# Patient Record
Sex: Female | Born: 1935 | Race: White | Hispanic: No | Smoking: Never smoker
Health system: Southern US, Community
[De-identification: ages and names within clinical notes are randomized; demographics above are authoritative.]

## PROBLEM LIST (undated history)

## (undated) DIAGNOSIS — R413 Other amnesia: Secondary | ICD-10-CM

## (undated) DIAGNOSIS — I10 Essential (primary) hypertension: Secondary | ICD-10-CM

## (undated) HISTORY — DX: Essential (primary) hypertension: I10

## (undated) HISTORY — PX: TONSILLECTOMY: SHX5217

## (undated) HISTORY — DX: Other amnesia: R41.3

## (undated) HISTORY — PX: CATARACT EXTRACTION, BILATERAL: SHX1313

## (undated) HISTORY — PX: THYROIDECTOMY: SHX17

---

## 2016-03-19 LAB — COMPREHENSIVE METABOLIC PANEL
Albumin: 4.1 (ref 3.5–5.0)
Calcium: 9.3 (ref 8.7–10.7)
GFR calc non Af Amer: 57
Globulin: 2

## 2016-03-19 LAB — BASIC METABOLIC PANEL
BUN: 18 (ref 4–21)
CO2: 22 (ref 13–22)
Chloride: 104 (ref 99–108)
Creatinine: 0.9 (ref 0.5–1.1)
Glucose: 112
Potassium: 4.4 (ref 3.4–5.3)
Sodium: 142 (ref 137–147)

## 2016-03-19 LAB — HEPATIC FUNCTION PANEL
ALT: 13 (ref 7–35)
AST: 18 (ref 13–35)
Alkaline Phosphatase: 56 (ref 25–125)
Bilirubin, Total: 0.6

## 2016-03-19 LAB — LIPID PANEL
Cholesterol: 203 — AB (ref 0–200)
HDL: 68 (ref 35–70)
LDL Cholesterol: 107
Triglycerides: 140 (ref 40–160)

## 2017-03-09 LAB — BASIC METABOLIC PANEL
BUN: 16 (ref 4–21)
CO2: 23 — AB (ref 13–22)
Chloride: 105 (ref 99–108)
Creatinine: 0.8 (ref 0.5–1.1)
Glucose: 99
Potassium: 4.9 (ref 3.4–5.3)
Sodium: 139 (ref 137–147)

## 2017-03-09 LAB — COMPREHENSIVE METABOLIC PANEL
Albumin: 4.3 (ref 3.5–5.0)
Calcium: 9.8 (ref 8.7–10.7)
GFR calc non Af Amer: 65
Globulin: 2.2

## 2017-03-09 LAB — TSH: TSH: 0.77 (ref <=5.90)

## 2017-03-09 LAB — VITAMIN D 25 HYDROXY (VIT D DEFICIENCY, FRACTURES): Vit D, 25-Hydroxy: 28.6

## 2019-01-03 LAB — CBC AND DIFFERENTIAL
HCT: 36 (ref 36–46)
Hemoglobin: 12.3 (ref 12.0–16.0)
Platelets: 199 (ref 150–399)
WBC: 7.6

## 2019-01-03 LAB — HEPATIC FUNCTION PANEL
ALT: 16 (ref 7–35)
AST: 30 (ref 13–35)
Alkaline Phosphatase: 56 (ref 25–125)
Bilirubin, Total: 1

## 2019-01-03 LAB — BASIC METABOLIC PANEL
BUN: 15 (ref 4–21)
CO2: 21 (ref 13–22)
Chloride: 108 (ref 99–108)
Creatinine: 0.8 (ref 0.5–1.1)
Glucose: 103
Potassium: 4 (ref 3.4–5.3)
Sodium: 142 (ref 137–147)

## 2019-01-03 LAB — COMPREHENSIVE METABOLIC PANEL
Albumin: 4.2 (ref 3.5–5.0)
Calcium: 9.3 (ref 8.7–10.7)
GFR calc non Af Amer: 61
Globulin: 2.1

## 2019-01-03 LAB — TSH: TSH: 0.67 (ref 0.41–5.90)

## 2019-01-03 LAB — LIPID PANEL
Cholesterol: 179 (ref 0–200)
HDL: 65 (ref 35–70)
LDL Cholesterol: 97
Triglycerides: 97 (ref 40–160)

## 2019-01-03 LAB — CBC: RBC: 3.92 (ref 3.87–5.11)

## 2019-02-04 LAB — ANA COMPREHENSIVE PANEL
ANA Ab, IFA: POSITIVE — AB
Anti JO-1: NEGATIVE
CCP IgG Antibodies: NEGATIVE
CENTROMERE AB: NEGATIVE
Rheumatoid Factor (IgA): NEGATIVE
Ribosomal P Ab: NEGATIVE
Scleroderma (Scl-70) (ENA) Antibody, IgG: NEGATIVE
Sjogren's Anti-SS-A: NEGATIVE
dsDNA Ab: NEGATIVE

## 2019-02-04 LAB — HEMOGLOBIN A1C: Hemoglobin A1C: 5.5

## 2019-02-04 LAB — VITAMIN D 25 HYDROXY (VIT D DEFICIENCY, FRACTURES): Vit D, 25-Hydroxy: 24

## 2019-02-04 LAB — VITAMIN B12: Vitamin B-12: 972

## 2019-02-04 LAB — TSH: TSH: 0.19 — AB (ref 0.41–5.90)

## 2019-04-18 LAB — TSH: TSH: 16.8 — AB (ref <=5.90)

## 2019-04-18 LAB — T4, FREE: Free T4: 1.06

## 2019-06-22 ENCOUNTER — Other Ambulatory Visit: Payer: Self-pay

## 2019-06-22 ENCOUNTER — Ambulatory Visit (INDEPENDENT_AMBULATORY_CARE_PROVIDER_SITE_OTHER): Payer: Medicare HMO | Admitting: Family Medicine

## 2019-06-22 ENCOUNTER — Encounter: Payer: Self-pay | Admitting: Family Medicine

## 2019-06-22 VITALS — BP 144/55 | HR 64 | Temp 97.3°F | Resp 16 | Ht 64.0 in | Wt 103.6 lb

## 2019-06-22 DIAGNOSIS — F0390 Unspecified dementia without behavioral disturbance: Secondary | ICD-10-CM | POA: Diagnosis not present

## 2019-06-22 DIAGNOSIS — I10 Essential (primary) hypertension: Secondary | ICD-10-CM | POA: Insufficient documentation

## 2019-06-22 DIAGNOSIS — E039 Hypothyroidism, unspecified: Secondary | ICD-10-CM | POA: Diagnosis not present

## 2019-06-22 DIAGNOSIS — F03A0 Unspecified dementia, mild, without behavioral disturbance, psychotic disturbance, mood disturbance, and anxiety: Secondary | ICD-10-CM

## 2019-06-22 DIAGNOSIS — F01518 Vascular dementia, unspecified severity, with other behavioral disturbance: Secondary | ICD-10-CM | POA: Insufficient documentation

## 2019-06-22 DIAGNOSIS — Z7689 Persons encountering health services in other specified circumstances: Secondary | ICD-10-CM | POA: Diagnosis not present

## 2019-06-22 DIAGNOSIS — F0151 Vascular dementia with behavioral disturbance: Secondary | ICD-10-CM | POA: Insufficient documentation

## 2019-06-22 DIAGNOSIS — E538 Deficiency of other specified B group vitamins: Secondary | ICD-10-CM | POA: Diagnosis not present

## 2019-06-22 NOTE — Patient Instructions (Addendum)
Thank you for coming to the office today.  Labs today, will re order medicines and thyroid once I review test results. You will get a phone call with results.  Requested records first for review. Can discuss future treatment plans. Consider medications for memory loss and other options.  Likely will work on referral to Chronic Care Management - phone call from Nurse Case Manager / Social Work to determine if any needs are in community.  Please schedule a Follow-up Appointment to: Return in about 6 weeks (around 08/03/2019) for 6 week Follow-up Memory Loss/Dementia - needs MMSE.  If you have any other questions or concerns, please feel free to call the office or send a message through MyChart. You may also schedule an earlier appointment if necessary.  Additionally, you may be receiving a survey about your experience at our office within a few days to 1 week by e-mail or mail. We value your feedback.  Saralyn Pilar, DO [facility], [place]

## 2019-06-22 NOTE — Progress Notes (Signed)
Subjective:    Patient ID: Kathy Mata, female    DOB: 1925-01-19, 86 y.o.   MRN: [id number]  Kathy Mata is a 86 y.o. female presenting on 06/22/2019 for Establish Care (memory issue)  Daughter in law - Blanca Friend here today with patient to provide history, patient has memory loss.  Patient was recommended to our office by friends of theirs who are my patient.  She has recently moved to this area, from [place] now to re-establish care.   HPI   Hypothyroidism Chronic problem. In past has been on other doses. Now currently has 1-2 weeks left on med for Levothyroxine daily. Was on 27 in past then they adjusted it to 50, then back up to 75, last lab was in 03/2019 entered into chart  Cognitive Impairment / Memory Loss vs Dementia Reports problem now for while increasing issue with memory loss and dementia type symptoms. She is trying to stay independent. Now moved to relocate with near friends/family. She lives alone has daughter in law who lives 3 min away helps her daily. She is not always keeping up with appetite and eating but not having other symptoms that limit her from eating. She has some mild weight loss. Still adjusting to this area and her new move - She enjoys line dancing - There were no reports of safety concerns related to her memory. No wandering or behavioral abnormality. - She is never on memory medication - She saw a Neurologist in consultation for this problem in [place] through [facility] and we are requesting records. They did MRI - ultimately report is that there was no significant problem for her.  Hypertension No prior problem but has had occasional elevated readings, was put on low dose med Metoprolol XL 25mg  daily, taking this now without issue. May have some anxiety with new doctors office.  Depression screen PHQ 2/9 06/22/2019  Decreased Interest 0  Down, Depressed, Hopeless 0  PHQ - 2 Score 0    Past Medical History:  Diagnosis Date  . Hypertension    . Memory deficit    Past Surgical History:  Procedure Laterality Date  . THYROIDECTOMY    . TONSILECTOMY, ADENOIDECTOMY, BILATERAL MYRINGOTOMY AND TUBES     Social History   Socioeconomic History  . Marital status: Not on file    Spouse name: Not on file  . Number of children: Not on file  . Years of education: Not on file  . Highest education level: Not on file  Occupational History  . Not on file  Tobacco Use  . Smoking status: Never Smoker  . Smokeless tobacco: Never Used  Substance and Sexual Activity  . Alcohol use: Never  . Drug use: Never  . Sexual activity: Not on file  Other Topics Concern  . Not on file  Social History Narrative  . Not on file   Social Determinants of Health   Financial Resource Strain:   . Difficulty of Paying Living Expenses:   Food Insecurity:   . Worried About 08/22/2019 in the Last Year:   . Programme researcher, broadcasting/film/video in the Last Year:   Transportation Needs:   . Barista (Medical):   Freight forwarder Lack of Transportation (Non-Medical):   Physical Activity:   . Days of Exercise per Week:   . Minutes of Exercise per Session:   Stress:   . Feeling of Stress :   Social Connections:   . Frequency of Communication with Friends and  Family:   . Frequency of Social Gatherings with Friends and Family:   . Attends Religious Services:   . Active Member of Clubs or Organizations:   . Attends Archivist Meetings:   Marland Kitchen Marital Status:   Intimate Partner Violence:   . Fear of Current or Ex-Partner:   . Emotionally Abused:   Marland Kitchen Physically Abused:   . Sexually Abused:    History reviewed. No pertinent family history. Current Outpatient Medications on File Prior to Visit  Medication Sig  . levothyroxine (SYNTHROID) 75 MCG tablet   . metoprolol succinate (TOPROL-XL) 25 MG 24 hr tablet    No current facility-administered medications on file prior to visit.    Review of Systems Per HPI unless specifically indicated above        Objective:    BP (!) 144/55   Pulse 64   Temp (!) 97.3 F (36.3 C) (Temporal)   Resp 16   Ht 5\' 4"  (1.626 m)   Wt 103 lb 9.6 oz (47 kg)   SpO2 98%   BMI 17.78 kg/m   Wt Readings from Last 3 Encounters:  06/22/19 103 lb 9.6 oz (47 kg)    Physical Exam Vitals and nursing note reviewed.  Constitutional:      General: She is not in acute distress.    Appearance: She is well-developed. She is not diaphoretic.     Comments: Currently well appearing 86 year old elderly female, comfortable, cooperative  HENT:     Head: Normocephalic and atraumatic.  Eyes:     General:        Right eye: No discharge.        Left eye: No discharge.     Conjunctiva/sclera: Conjunctivae normal.  Neck:     Thyroid: No thyromegaly.  Cardiovascular:     Rate and Rhythm: Normal rate and regular rhythm.     Heart sounds: Normal heart sounds. No murmur.  Pulmonary:     Effort: Pulmonary effort is normal. No respiratory distress.     Breath sounds: Normal breath sounds. No wheezing or rales.  Musculoskeletal:        General: Normal range of motion.     Cervical back: Normal range of motion and neck supple.  Lymphadenopathy:     Cervical: No cervical adenopathy.  Skin:    General: Skin is warm and dry.     Findings: No erythema or rash.  Neurological:     Mental Status: She is alert. Mental status is at baseline.     Sensory: No sensory deficit.     Motor: No weakness.     Comments: Oriented to person only  Psychiatric:        Behavior: Behavior normal.     Comments: Well groomed, good eye contact, normal speech and thoughts but difficulty participating in conversation due to poor hearing and cognition. She is able to respond appropriately when asked questions and follows most commands, she has difficulty answering more complex questions due to cognitive processing and exhibits a lot of word finding or misunderstanding and incomplete answers. Frustrated at times will give up on an answer.       6CIT Screen 06/22/2019  What Year? 4 points  What month? 3 points  What time? 0 points  Count back from 20 0 points  Months in reverse 4 points  Repeat phrase 10 points  Total Score 21     Results for orders placed or performed in visit on 06/22/19  TSH  Result Value Ref Range   TSH 16.80 (A) 0.41 - 5.90  T4, free  Result Value Ref Range   Free T4 1.06       Assessment & Plan:   Problem List Items Addressed This Visit    Mild dementia (HCC)   Relevant Orders   TSH   T4, free   COMPLETE METABOLIC PANEL WITH GFR   CBC with Differential/Platelet   Vitamin B12   Ambulatory referral to Chronic Care Management Services   Essential hypertension   Relevant Medications   metoprolol succinate (TOPROL-XL) 25 MG 24 hr tablet   Other Relevant Orders   COMPLETE METABOLIC PANEL WITH GFR   CBC with Differential/Platelet   Ambulatory referral to Chronic Care Management Services   Acquired hypothyroidism - Primary   Relevant Medications   levothyroxine (SYNTHROID) 75 MCG tablet   metoprolol succinate (TOPROL-XL) 25 MG 24 hr tablet   Other Relevant Orders   TSH   T4, free   Ambulatory referral to Chronic Care Management Services    Other Visit Diagnoses    Encounter to establish care with new doctor       Vitamin B12 deficiency       Relevant Orders   Vitamin B12      #Dementia Currently stable, with history of progressive cognitive decline memory loss vs new concern for dementia, clinically FAST Stage 4 approximately (needs help with ADLs), no history behavior or safety concerns. - Some increased caregiver needs but tries to stay independent - never on meds Had neuro eval [place]/[facility] requesting record today including MRI  Plan: 1. Reviewed Dementia prognosis and management, explained that may need additional support in future. - referral today to CCM nurse case management and social work for further evaluation of community and care needs, with goal to keep her  independent and supported. May need inc lvl of care in future.   Requested outside records from prior PCP Dr  Mare Ferrari [facility]) also requested Dr Burnett Corrente [facility] out of [place].  #Hypothyroidism Chronic problem Stable, on medication daily levothyroxine Last lab reviewed 03/2019 elevated TSH >16 Will re-check labs today  Also check chemistry vitamin testing in setting of dementia  #HTN keep on current medication   Orders Placed This Encounter  Procedures  . TSH  . T4, free  . COMPLETE METABOLIC PANEL WITH GFR  . CBC with Differential/Platelet  . Vitamin B12  . TSH    This external order was created through the Results Console.  . T4, free    This order was created through External Result Entry  . Ambulatory referral to Chronic Care Management Services    Referral Priority:   Routine    Referral Type:   Consultation    Referral Reason:   Care Coordination    Number of Visits Requested:   1     No orders of the defined types were placed in this encounter.   Follow up plan: Return in about 6 weeks (around 08/03/2019) for 6 week Follow-up Memory Loss/Dementia - needs MMSE.  Saralyn Pilar, DO [facility] 06/22/2019, 10:38 AM

## 2019-06-23 ENCOUNTER — Telehealth: Payer: Self-pay | Admitting: Family Medicine

## 2019-06-23 LAB — TSH: TSH: 1.5 mIU/L (ref 0.40–4.50)

## 2019-06-23 LAB — VITAMIN B12: Vitamin B-12: 322 pg/mL (ref 200–1100)

## 2019-06-23 LAB — COMPLETE METABOLIC PANEL WITH GFR
AG Ratio: 1.7 (calc) (ref 1.0–2.5)
ALT: 34 U/L — ABNORMAL HIGH (ref 6–29)
AST: 27 U/L (ref 10–35)
Albumin: 4.7 g/dL (ref 3.6–5.1)
Alkaline phosphatase (APISO): 55 U/L (ref 37–153)
BUN: 14 mg/dL (ref 7–25)
CO2: 23 mmol/L (ref 20–32)
Calcium: 9.8 mg/dL (ref 8.6–10.4)
Chloride: 106 mmol/L (ref 98–110)
Creat: 0.82 mg/dL (ref 0.60–0.88)
GFR, Est African American: 71 mL/min/{1.73_m2} (ref >=60)
GFR, Est Non African American: 62 mL/min/{1.73_m2} (ref >=60)
Globulin: 2.8 g/dL (calc) (ref 1.9–3.7)
Glucose, Bld: 80 mg/dL (ref 65–99)
Potassium: 4.4 mmol/L (ref 3.5–5.3)
Sodium: 140 mmol/L (ref 135–146)
Total Bilirubin: 0.3 mg/dL (ref 0.2–1.2)
Total Protein: 7.5 g/dL (ref 6.1–8.1)

## 2019-06-23 LAB — CBC WITH DIFFERENTIAL/PLATELET
Absolute Monocytes: 504 cells/uL (ref 200–950)
Basophils Absolute: 34 cells/uL (ref 0–200)
Basophils Relative: 0.4 %
Eosinophils Absolute: 67 cells/uL (ref 15–500)
Eosinophils Relative: 0.8 %
HCT: 41.3 % (ref 35.0–45.0)
Hemoglobin: 13.4 g/dL (ref 11.7–15.5)
Lymphs Abs: 2915 cells/uL (ref 850–3900)
MCH: 28.6 pg (ref 27.0–33.0)
MCHC: 32.4 g/dL (ref 32.0–36.0)
MCV: 88.2 fL (ref 80.0–100.0)
MPV: 9.5 fL (ref 7.5–12.5)
Monocytes Relative: 6 %
Neutro Abs: 4880 cells/uL (ref 1500–7800)
Neutrophils Relative %: 58.1 %
Platelets: 275 10*3/uL (ref 140–400)
RBC: 4.68 10*6/uL (ref 3.80–5.10)
RDW: 13.3 % (ref 11.0–15.0)
Total Lymphocyte: 34.7 %
WBC: 8.4 10*3/uL (ref 3.8–10.8)

## 2019-06-23 LAB — T4, FREE: Free T4: 1.1 ng/dL (ref 0.8–1.8)

## 2019-06-23 NOTE — Chronic Care Management (AMB) (Signed)
  Care Management   Outreach Note  06/23/2019 Name: Kathy Mata MRN: [id number] DOB: 01/25/1924  Referred by: [name], DO Reason for referral :  Care Management (CM Initial outreach unsuccessful)   An unsuccessful telephone outreach was attempted today. The patient was referred to the case management team for assistance with care management and care coordination.   Follow Up Plan: A HIPPA compliant phone message was left for the patient providing contact information and requesting a return call.  The care management team will reach out to the patient again over the next 7 days.  If patient returns call to provider office, please advise to call Embedded Care Management Care Guide Elisha Ponder LPN at [phone]  Elisha Ponder, LPN Health Advisor, Embedded Care Coordination [facility] Health Care Management ??[email or web address] ??[phone]

## 2019-06-28 ENCOUNTER — Other Ambulatory Visit: Payer: Self-pay

## 2019-06-28 DIAGNOSIS — I10 Essential (primary) hypertension: Secondary | ICD-10-CM

## 2019-06-28 DIAGNOSIS — E039 Hypothyroidism, unspecified: Secondary | ICD-10-CM

## 2019-06-28 MED ORDER — METOPROLOL SUCCINATE ER 25 MG PO TB24: 25.0000 mg | ORAL_TABLET | Freq: Every day | ORAL | 1 refills | Status: AC

## 2019-06-28 MED ORDER — LEVOTHYROXINE SODIUM 75 MCG PO TABS: 75.0000 ug | ORAL_TABLET | Freq: Every day | ORAL | 1 refills | Status: AC

## 2019-06-30 NOTE — Chronic Care Management (AMB) (Signed)
  Care Management   Note  06/30/2019 Name: Kathy Mata MRN: [id number] DOB: [birth year]  Kathy Mata is a 86 y.o. year old female who is a primary care patient of [name], DO. I reached out to Elvin So by phone today in response to a referral sent by [name] health plan.    [name] was given information about care management services today including:  1. Care management services include personalized support from designated clinical staff supervised by her physician, including individualized plan of care and coordination with other care providers 2. 24/7 contact phone numbers for assistance for urgent and routine care needs. 3. The patient may stop care management services at any time by phone call to the office staff.  Patient wishes to consider information provided and/or speak with a member of the care team before deciding about enrollment in care management services.   Follow up plan: Outreach by embedded care coordination clinical team member scheduled. Clinician will address patient questions and provide further information about services. Clinician will verify enrollment decision at the time of outreach.   Elisha Ponder, LPN Health Advisor, Embedded Care Coordination [place] Care Management ??[email or web address] ??[phone]

## 2019-07-03 ENCOUNTER — Ambulatory Visit: Payer: Self-pay | Admitting: General Practice

## 2019-07-03 ENCOUNTER — Telehealth: Payer: Self-pay

## 2019-07-03 NOTE — Chronic Care Management (AMB) (Signed)
  Care Management   Note  07/03/2019 Name: Kathy Mata MRN: [id number] DOB: 03/13/1924  Initial outreach call made to the patients DIL, Blanca Friend today. The DIL takes care of the patients needs and had ask the enrollment team to have the [facility] call and explain services for the CCM program. Program details explained to the DIL.  The DIL states that the patient is getting settled in and she knows there will be a time when she will need more assistance but she wants to give it time to see how the patient will do in her new environment.  Extensive information provided to the DIL of how the pharmacist, LCSW, and RNCM work together with the pcp to help the patient and the caregivers get the things needed for the patient to maintain her health and well being.   The DIL states the only thing needed right now is for her to have a better understanding of why the patients memory is declining and what is the best way to handle the situation when she tells her DIL "this is not mine, this is yours".  Recommendation given to the DIL on the book: "The 36-hour day".  The DIL still declined services at this time but will reconsider if needed later. The DIL did ask if the RNCM could have the pcp office call her because she received a bill for services for the MD visit.  The DIL expressed she normally does not have any copays for MD visits. Will send an in-basket message to the office staff to have someone reach out to the DIL for help with this matter.   The care management team is available to follow up with the patient after provider conversation with the patient regarding recommendation for care management engagement and subsequent re-referral to the care management team.   Alto Denver RN, MSN, CCM Community Care Coordinator [facility]  [facility] [place] Mobile: [phone]

## 2019-07-06 ENCOUNTER — Other Ambulatory Visit: Payer: Self-pay | Admitting: Family Medicine

## 2019-07-06 ENCOUNTER — Telehealth: Payer: Self-pay

## 2019-07-06 ENCOUNTER — Encounter: Payer: Self-pay | Admitting: Family Medicine

## 2019-07-06 DIAGNOSIS — I6521 Occlusion and stenosis of right carotid artery: Secondary | ICD-10-CM

## 2019-07-06 DIAGNOSIS — I6523 Occlusion and stenosis of bilateral carotid arteries: Secondary | ICD-10-CM | POA: Insufficient documentation

## 2019-07-06 DIAGNOSIS — I739 Peripheral vascular disease, unspecified: Secondary | ICD-10-CM | POA: Insufficient documentation

## 2019-07-06 NOTE — Telephone Encounter (Signed)
Copied from CRM [id number]. Topic: General - Other >> Jul 06, 2019  9:49 AM Herby Abraham C wrote: Reason for CRM: pt's daughter called in to request a letter from provider stating that pt is no longer able to handle her financial affairs. Daughter would like to pick up whenever ready.    Anne: [phone]

## 2019-07-06 NOTE — Telephone Encounter (Signed)
Called [employer] HCPOA. Reviewed outside records [facility], scanned to chart. Updated lab results abstracted.  See letter written today. Regarding her dementia memory loss and can no longer manage her finances.  Letter given to Thurston Hole already today at office.  Saralyn Pilar, DO [facility] 07/06/2019, 12:16 PM

## 2019-07-16 ENCOUNTER — Encounter: Payer: Self-pay | Admitting: Family Medicine

## 2019-07-16 DIAGNOSIS — I6523 Occlusion and stenosis of bilateral carotid arteries: Secondary | ICD-10-CM

## 2019-07-16 DIAGNOSIS — I700 Atherosclerosis of aorta: Secondary | ICD-10-CM | POA: Insufficient documentation

## 2019-07-18 ENCOUNTER — Ambulatory Visit: Payer: Self-pay | Admitting: Family Medicine

## 2019-07-20 ENCOUNTER — Telehealth: Payer: Self-pay

## 2019-07-31 DIAGNOSIS — F418 Other specified anxiety disorders: Secondary | ICD-10-CM

## 2019-07-31 DIAGNOSIS — F0390 Unspecified dementia without behavioral disturbance: Secondary | ICD-10-CM

## 2019-07-31 DIAGNOSIS — F03A0 Unspecified dementia, mild, without behavioral disturbance, psychotic disturbance, mood disturbance, and anxiety: Secondary | ICD-10-CM

## 2019-07-31 MED ORDER — ESCITALOPRAM OXALATE 5 MG PO TABS: 5.0000 mg | ORAL_TABLET | Freq: Every day | ORAL | 0 refills | Status: DC

## 2019-08-03 ENCOUNTER — Ambulatory Visit (INDEPENDENT_AMBULATORY_CARE_PROVIDER_SITE_OTHER): Payer: Medicare HMO | Admitting: Family Medicine

## 2019-08-03 ENCOUNTER — Ambulatory Visit: Payer: Self-pay | Admitting: Family Medicine

## 2019-08-03 ENCOUNTER — Encounter: Payer: Self-pay | Admitting: Family Medicine

## 2019-08-03 ENCOUNTER — Other Ambulatory Visit: Payer: Self-pay

## 2019-08-03 VITALS — BP 118/53 | HR 80 | Temp 96.9°F | Resp 16 | Ht 64.0 in | Wt 100.0 lb

## 2019-08-03 DIAGNOSIS — F0390 Unspecified dementia without behavioral disturbance: Secondary | ICD-10-CM | POA: Diagnosis not present

## 2019-08-03 DIAGNOSIS — E039 Hypothyroidism, unspecified: Secondary | ICD-10-CM

## 2019-08-03 DIAGNOSIS — F03A0 Unspecified dementia, mild, without behavioral disturbance, psychotic disturbance, mood disturbance, and anxiety: Secondary | ICD-10-CM

## 2019-08-03 DIAGNOSIS — I700 Atherosclerosis of aorta: Secondary | ICD-10-CM

## 2019-08-03 NOTE — Assessment & Plan Note (Signed)
Identified on image Not on statin therapy. Declined She is on ASA 325 per neurology Patient is on BB for HTN.

## 2019-08-03 NOTE — Patient Instructions (Addendum)
Thank you for coming to the office today.  Start Escitalopram 5mg  daily Keep on current dose Levothyroxine daily  Let know if you need any extra help.   Please schedule a Follow-up Appointment to: Return in about 6 months (around 02/03/2020) for 6 month follow-up Thyroid, Dementia, Mood, Lab draw after visit.  If you have any other questions or concerns, please feel free to call the office or send a message through MyChart. You may also schedule an earlier appointment if necessary.  Additionally, you may be receiving a survey about your experience at our office within a few days to 1 week by e-mail or mail. We value your feedback.  02/05/2020, DO [facility], [facility]

## 2019-08-03 NOTE — Progress Notes (Signed)
Subjective:    Patient ID: Kathy Mata, female    DOB: 20-Apr-1924, 86 y.o.   MRN: [id number]  Kathy Mata is a 86 y.o. female presenting on 08/03/2019 for Dementia  Accompanied by daughter in law, Blanca Friend [facility]. Patient is unable to provide history due to dementia.  HPI    Hypothyroidism Chronic problem. In past has been on other dose from 50 to 88. Last visit had labs 06/2019, has been stable on Levothyroxine daily now. With normal TSH and Free T4.  Dementia Chronic progressive problem. See initial intake history 06/22/19 Interval update, reviewed records from [facility]. To be scanned to chart. Reviewed prior imaging MRI Brain.  Reports problem now for while increasing issue with memory loss and dementia type symptoms. She is trying to stay independent. Now moved to relocate with near friends/family. She lives alone has daughter in law who lives 3 min away helps her daily - No wandering or behavioral abnormality. - She is never on memory medication  Patient/HCPOA was contacted by our Chronic Care Management Team in interval to connect to community resources and they declined to continue these services, stating it is not needed at this time.  Labs from last visit reviewed.  Today report is no new change. HCPOA did contact us via MyChart earlier this week to request for Lexapro 5mg  due to patient's agitation, seems to report labile agitation and mood at times.   Aortic Atherosclerosis Identified on imaging. Outside Abdominal Aortic 2018 - confirms but no evidence AAA   Depression screen [facility] 2/9 08/03/2019 06/22/2019  Decreased Interest 0 0  Down, Depressed, Hopeless 0 0  PHQ - 2 Score 0 0   No flowsheet data found.    Social History   Tobacco Use  . Smoking status: Never Smoker  . Smokeless tobacco: Never Used  Substance Use Topics  . Alcohol use: Never  . Drug use: Never    Review of Systems Per HPI unless specifically indicated above       Objective:    BP (!) 118/53   Pulse 80   Temp (!) 96.9 F (36.1 C) (Temporal)   Resp 16   Ht 5\' 4"  (1.626 m)   Wt 100 lb (45.4 kg)   SpO2 99%   BMI 17.16 kg/m   Wt Readings from Last 3 Encounters:  08/03/19 100 lb (45.4 kg)  06/22/19 103 lb 9.6 oz (47 kg)    Physical Exam Vitals and nursing note reviewed.  Constitutional:      General: She is not in acute distress.    Appearance: She is well-developed. She is not diaphoretic.     Comments: Currently well appearing 86 year old elderly female, comfortable, cooperative  HENT:     Head: Normocephalic and atraumatic.  Eyes:     General:        Right eye: No discharge.        Left eye: No discharge.     Conjunctiva/sclera: Conjunctivae normal.  Neck:     Thyroid: No thyromegaly.  Cardiovascular:     Rate and Rhythm: Normal rate and regular rhythm.     Heart sounds: Normal heart sounds. No murmur heard.   Pulmonary:     Effort: Pulmonary effort is normal. No respiratory distress.     Breath sounds: Normal breath sounds. No wheezing or rales.  Musculoskeletal:        General: Normal range of motion.     Cervical back: Normal range of motion  and neck supple.  Lymphadenopathy:     Cervical: No cervical adenopathy.  Skin:    General: Skin is warm and dry.     Findings: No erythema or rash.  Neurological:     Mental Status: She is alert. Mental status is at baseline.     Sensory: No sensory deficit.     Motor: No weakness.     Comments: Oriented to person only  Psychiatric:        Behavior: Behavior normal.     Comments: Well groomed, good eye contact, normal speech and thoughts but difficulty participating in conversation due to poor hearing and cognition. She is able to respond appropriately when asked questions and follows most commands, she has difficulty answering more complex questions due to cognitive processing and exhibits a lot of word finding or misunderstanding and incomplete answers. She is frequently repeating  phrases and responses. Very poor insight.      MMSE - Mini Mental State Exam 08/03/2019  Orientation to time 1  Orientation to Place 1  Registration 3  Attention/ Calculation 5  Recall 2  Language- name 2 objects 2  Language- repeat 0  Language- follow 3 step command 3  Language- read & follow direction 0  Write a sentence 1  Copy design 1  Total score 19    6CIT Screen 06/22/2019  What Year? 4 points  What month? 3 points  What time? 0 points  Count back from 20 0 points  Months in reverse 4 points  Repeat phrase 10 points  Total Score 21      Results for orders placed or performed in visit on 07/16/19  Basic metabolic panel  Result Value Ref Range   Glucose 112    BUN 18 4 - 21   CO2 22 13 - 22   Creatinine 0.9 0.5 - 1.1   Potassium 4.4 3.4 - 5.3   Sodium 142 137 - 147   Chloride 104 99 - 108  Comprehensive metabolic panel  Result Value Ref Range   Globulin 2    GFR calc non Af Amer 57    Calcium 9.3 8.7 - 10.7   Albumin 4.1 3.5 - 5.0  Hepatic function panel  Result Value Ref Range   Alkaline Phosphatase 56 25 - 125   ALT 13 7 - 35   AST 18 13 - 35   Bilirubin, Total 0.6   Lipid panel  Result Value Ref Range   Triglycerides 140 40 - 160   Cholesterol 203 (A) 0 - 200   HDL 68 35 - 70   LDL Cholesterol 107   VITAMIN D 25 Hydroxy (Vit-D Deficiency, Fractures)  Result Value Ref Range   Vit D, 25-Hydroxy 28.6   Basic metabolic panel  Result Value Ref Range   Glucose 99    BUN 16 4 - 21   CO2 23 (A) 13 - 22   Creatinine 0.8 0.5 - 1.1   Potassium 4.9 3.4 - 5.3   Sodium 139 137 - 147   Chloride 105 99 - 108  Comprehensive metabolic panel  Result Value Ref Range   Globulin 2.2    GFR calc non Af Amer 65    Calcium 9.8 8.7 - 10.7   Albumin 4.3 3.5 - 5.0  TSH  Result Value Ref Range   TSH 0.77 0.41 - 5.90      Assessment & Plan:   Problem List Items Addressed This Visit    Mild dementia (HCC) -  Primary   Aortic atherosclerosis (HCC)     Identified on image Not on statin therapy. Declined She is on ASA 325 per neurology Patient is on BB for HTN.      Acquired hypothyroidism      #Dementia Currently stable, with history of progressive cognitive decline memory loss vs new concern for dementia, clinically FAST Stage 4 approximately (needs help with ADLs), no history behavior or safety concerns. - Some increased caregiver needs but tries to stay independent - never on meds Reviewed prior Neurology evaluation including MRI.  Plan: 1. Reviewed Dementia prognosis and management, explained that may need additional support in future. She has already been referred to our CCM team and these services were declined by [facility] at this time. I advised them that we are happy to connect them with any community resources or other assistance in future as needed, just contact us back. - Defer future neurology referral - Defer dementia medications at this time, discussed benefit/risk side effect, can reconsider - Will add Escitalopram 5mg  daily for mood/agitation only  #Hypothyroidism Chronic problem Stable, on medication daily levothyroxine Last lab reviewed 06/2019 normal Repeat in 6 months approx.   No orders of the defined types were placed in this encounter.    Follow up plan: Return in about 6 months (around 02/03/2020) for 6 month follow-up Thyroid, Dementia, Mood, Lab draw after visit.  02/05/2020, DO [facility] 08/03/2019, 12:10 PM

## 2019-08-15 DIAGNOSIS — F03A0 Unspecified dementia, mild, without behavioral disturbance, psychotic disturbance, mood disturbance, and anxiety: Secondary | ICD-10-CM

## 2019-08-15 DIAGNOSIS — F0390 Unspecified dementia without behavioral disturbance: Secondary | ICD-10-CM

## 2019-08-16 ENCOUNTER — Other Ambulatory Visit: Payer: Self-pay | Admitting: Family Medicine

## 2019-08-16 DIAGNOSIS — F418 Other specified anxiety disorders: Secondary | ICD-10-CM

## 2019-08-16 DIAGNOSIS — F03A0 Unspecified dementia, mild, without behavioral disturbance, psychotic disturbance, mood disturbance, and anxiety: Secondary | ICD-10-CM

## 2019-08-16 DIAGNOSIS — F0390 Unspecified dementia without behavioral disturbance: Secondary | ICD-10-CM

## 2019-08-16 MED ORDER — DONEPEZIL HCL 5 MG PO TABS: 5.0000 mg | ORAL_TABLET | Freq: Every day | ORAL | 2 refills | Status: DC

## 2019-08-16 NOTE — Addendum Note (Signed)
Addended by: [name] on: 08/16/2019 08:12 AM   Modules accepted: Orders

## 2019-08-25 DIAGNOSIS — F03A0 Unspecified dementia, mild, without behavioral disturbance, psychotic disturbance, mood disturbance, and anxiety: Secondary | ICD-10-CM

## 2019-08-25 DIAGNOSIS — F418 Other specified anxiety disorders: Secondary | ICD-10-CM

## 2019-08-25 DIAGNOSIS — F0390 Unspecified dementia without behavioral disturbance: Secondary | ICD-10-CM

## 2019-08-25 MED ORDER — ESCITALOPRAM OXALATE 10 MG PO TABS: 10.0000 mg | ORAL_TABLET | Freq: Every day | ORAL | 2 refills | Status: DC

## 2019-09-05 ENCOUNTER — Ambulatory Visit (INDEPENDENT_AMBULATORY_CARE_PROVIDER_SITE_OTHER): Payer: Medicare HMO | Admitting: General Practice

## 2019-09-05 DIAGNOSIS — I10 Essential (primary) hypertension: Secondary | ICD-10-CM

## 2019-09-05 DIAGNOSIS — I6521 Occlusion and stenosis of right carotid artery: Secondary | ICD-10-CM

## 2019-09-05 DIAGNOSIS — I700 Atherosclerosis of aorta: Secondary | ICD-10-CM

## 2019-09-05 DIAGNOSIS — F03A0 Unspecified dementia, mild, without behavioral disturbance, psychotic disturbance, mood disturbance, and anxiety: Secondary | ICD-10-CM

## 2019-09-05 DIAGNOSIS — F0390 Unspecified dementia without behavioral disturbance: Secondary | ICD-10-CM

## 2019-09-05 DIAGNOSIS — I739 Peripheral vascular disease, unspecified: Secondary | ICD-10-CM

## 2019-09-05 NOTE — Patient Instructions (Signed)
Visit Information  Goals Addressed              This Visit's Progress   .  RNCM: Chronic Disease Management and Care coordination Needs        CARE PLAN ENTRY (see longtitudinal plan of care for additional care plan information)  Current Barriers:  . Chronic Disease Management support, education, and care coordination needs related to HTN, carotid artery stenosis, PAD, and Hypothyroidism   Clinical Goal(s) related to HTN, carotid artery stenosis, PAD, and Hypothyroidism :  Over the next 120 days, patient will:  . Work with the care management team to address educational, disease management, and care coordination needs  . Begin or continue self health monitoring activities as directed today  adhere to a heart healthy diet . Call provider office for new or worsened signs and symptoms Shortness of breath and New or worsened symptom related to HTN/PAD/Hypothyroidism/carotid artery stenosis and other chronic conditions  . Call care management team with questions or concerns . Verbalize basic understanding of patient centered plan of care established today  Interventions related to HTN, carotid artery stenosis, PAD, and Hypothyroidism :  . Evaluation of current treatment plans and patient's adherence to plan as established by provider . Assessed patient understanding of disease states . Assessed patient's education and care coordination needs . Provided disease specific education to patient  . Collaborated with appropriate clinical care team members regarding patient needs  Patient Self Care Activities related to {HTN, carotid artery stenosis, PAD, and Hypothyroidism :  . Patient is unable to independently self-manage chronic health conditions  Initial goal documentation     .  RNCM: Pt's DIL-"Things are not going well" (pt-stated)        CARE PLAN ENTRY (see longitudinal plan of care for additional care plan information)  Current Barriers:  Marland Kitchen Knowledge Deficits related to patient  with advanced dementia currently living alone, family seeking guidance on best course of action . Care Coordination needs related to ADL/IADLS needs in a patient with dementia  (disease states) . Chronic Disease Management support and education needs related to patient with Dementia and family verbalizing the patient is not doing well in her surroundings (example calling son and DIL 41 times) . Lacks caregiver support.  . Film/video editor.  . Cognitive Deficits  Nurse Case Manager Clinical Goal(s):  Marland Kitchen Over the next 120 days, patient will verbalize understanding of plan for resources and treatment options for patient with advancing dementia . Over the next 120 days, patient will work with [facility], [place] team, and pcp to address needs related to declining memory and dementia in elderly patient . Over the next 120 days, patient will attend all scheduled medical appointments: front office staff to call the patients daughter in law to set up an appointment to see pcp . Over the next 120 days, patient will demonstrate improved adherence to prescribed treatment plan for dementia  as evidenced bycompliance with medications, safe living arrangements, and support for caregivers . Over the next 120 days, patient will work with CM team pharmacist to for medication review, medication reconciliation, and possible medication advice for patient with advanced dementia . Over the next 120 days, patient will work with CM clinical social worker to caregiver strain, placement questions and help, and support for patient with advancing dementia . Over the next 120 days, patient will work with care guides for resources for sitter information and other needs  (community agency) to help with patient with advancing dementia  Interventions:  .  Inter-disciplinary care team collaboration (see longitudinal plan of care) . Evaluation of current treatment plan related to demenita and patient's adherence to plan as established by  provider. . Advised patient to DIL to work with the CCM team to meet patient needs. Education provided on getting an appointment to see the pcp for follow up and recommendations . Provided education to patient re: safety in the patient home, not trying to dispute the patient or argue with the patient, and general ideas to help the patient remain safe in her home environment  . Reviewed medications with patient and discussed compliance. The DIL and son are making sure the patient is taking her medications as prescribed . Collaborated with pcp and CCM team regarding change in patients condition . Discussed plans with patient for ongoing care management follow up and provided patient with direct contact information for care management team . Reviewed scheduled/upcoming provider appointments including: [place] office to call and schedule appointment with the DIL to bring patient in to see pcp.  . Care Guide referral for resources to help the family in the county for dementia and support  . Social Work referral for support and education and resources for advanced dementia patients, placement questions  . Pharmacy referral for medication management, reconciliation and recommendations  Patient Self Care Activities:  . Patient verbalizes understanding of plan to work with the pcp and CCM team for best options in patient with advanced dementia . Attends all scheduled provider appointments . Calls provider office for new concerns or questions . Unable to independently manage care in patient with advanced dementia   Initial goal documentation        Ms. Lech was given information about Chronic Care Management services today including:  1. CCM service includes personalized support from designated clinical staff supervised by her physician, including individualized plan of care and coordination with other care providers 2. 24/7 contact phone numbers for assistance for urgent and routine care  needs. 3. Service will only be billed when office clinical staff spend 20 minutes or more in a month to coordinate care. 4. Only one practitioner may furnish and bill the service in a calendar month. 5. The patient may stop CCM services at any time (effective at the end of the month) by phone call to the office staff. 6. The patient will be responsible for cost sharing (co-pay) of up to 20% of the service fee (after annual deductible is met).  Patient agreed to services and verbal consent obtained.   Patient verbalizes understanding of instructions provided today.   The care management team will reach out to the patient again over the next 30 days.   Noreene Larsson RN, MSN, [place] Mobile: [phone]

## 2019-09-05 NOTE — Chronic Care Management (AMB) (Signed)
Chronic Care Management   Initial Visit Note  09/05/2019 Name: Kathy Mata MRN: [id number] DOB: [birth year]  Referred by: Olin Hauser, DO Reason for referral : Chronic Care Management (Returning call to the DIL after Voicemail recieved)   Kathy Mata is a 86 y.o. year old female who is a primary care patient of Olin Hauser, DO. The CCM team was consulted for assistance with chronic disease management and care coordination needs related to HTN, Dementia and PAD, carotid artery stenosis, and hypothyroidism   Review of patient status, including review of consultants reports, relevant laboratory and other test results, and collaboration with appropriate care team members and the patient's provider was performed as part of comprehensive patient evaluation and provision of chronic care management services.    SDOH (Social Determinants of Health) assessments performed: Yes See Care Plan activities for detailed interventions related to SDOH     Medications: Outpatient Encounter Medications as of 09/05/2019  Medication Sig  . aspirin EC 325 MG tablet Take 1 tablet (325 mg total) by mouth daily.  . calcium carbonate (OSCAL) 1500 (600 Ca) MG TABS tablet Take by mouth.  . Cholecalciferol 25 MCG (1000 UT) tablet Take by mouth.  . cyanocobalamin 1000 MCG tablet Take 1 tablet by mouth daily.  Marland Kitchen donepezil (ARICEPT) 5 MG tablet Take 1 tablet (5 mg total) by mouth at bedtime.  Marland Kitchen escitalopram (LEXAPRO) 10 MG tablet Take 1 tablet (10 mg total) by mouth daily.  Marland Kitchen levothyroxine (SYNTHROID) 75 MCG tablet Take 1 tablet (75 mcg total) by mouth daily.  . metoprolol succinate (TOPROL-XL) 25 MG 24 hr tablet Take 1 tablet (25 mg total) by mouth daily.   No facility-administered encounter medications on file as of 09/05/2019.     Objective:   Goals Addressed              This Visit's Progress   .  RNCM: Chronic Disease Management and Care coordination Needs        CARE PLAN  ENTRY (see longtitudinal plan of care for additional care plan information)  Current Barriers:  . Chronic Disease Management support, education, and care coordination needs related to HTN, carotid artery stenosis, PAD, and Hypothyroidism   Clinical Goal(s) related to HTN, carotid artery stenosis, PAD, and Hypothyroidism :  Over the next 120 days, patient will:  . Work with the care management team to address educational, disease management, and care coordination needs  . Begin or continue self health monitoring activities as directed today  adhere to a heart healthy diet . Call provider office for new or worsened signs and symptoms Shortness of breath and New or worsened symptom related to HTN/PAD/Hypothyroidism/carotid artery stenosis and other chronic conditions  . Call care management team with questions or concerns . Verbalize basic understanding of patient centered plan of care established today  Interventions related to HTN, carotid artery stenosis, PAD, and Hypothyroidism :  . Evaluation of current treatment plans and patient's adherence to plan as established by provider . Assessed patient understanding of disease states . Assessed patient's education and care coordination needs . Provided disease specific education to patient  . Collaborated with appropriate clinical care team members regarding patient needs  Patient Self Care Activities related to {HTN, carotid artery stenosis, PAD, and Hypothyroidism :  . Patient is unable to independently self-manage chronic health conditions  Initial goal documentation     .  RNCM: Pt's DIL-"Things are not going well" (pt-stated)        CARE  PLAN ENTRY (see longitudinal plan of care for additional care plan information)  Current Barriers:  Marland Kitchen Knowledge Deficits related to patient with advanced dementia currently living alone, family seeking guidance on best course of action . Care Coordination needs related to ADL/IADLS needs in a patient  with dementia  (disease states) . Chronic Disease Management support and education needs related to patient with Dementia and family verbalizing the patient is not doing well in her surroundings (example calling son and DIL 41 times) . Lacks caregiver support.  . Film/video editor.  . Cognitive Deficits  Nurse Case Manager Clinical Goal(s):  Marland Kitchen Over the next 120 days, patient will verbalize understanding of plan for resources and treatment options for patient with advancing dementia . Over the next 120 days, patient will work with [facility], [place] team, and pcp to address needs related to declining memory and dementia in elderly patient . Over the next 120 days, patient will attend all scheduled medical appointments: front office staff to call the patients daughter in law to set up an appointment to see pcp . Over the next 120 days, patient will demonstrate improved adherence to prescribed treatment plan for dementia  as evidenced bycompliance with medications, safe living arrangements, and support for caregivers . Over the next 120 days, patient will work with CM team pharmacist to for medication review, medication reconciliation, and possible medication advice for patient with advanced dementia . Over the next 120 days, patient will work with CM clinical social worker to caregiver strain, placement questions and help, and support for patient with advancing dementia . Over the next 120 days, patient will work with care guides for resources for sitter information and other needs  (community agency) to help with patient with advancing dementia  Interventions:  . Inter-disciplinary care team collaboration (see longitudinal plan of care) . Evaluation of current treatment plan related to demenita and patient's adherence to plan as established by provider. . Advised patient to DIL to work with the CCM team to meet patient needs. Education provided on getting an appointment to see the pcp for follow up and  recommendations . Provided education to patient re: safety in the patient home, not trying to dispute the patient or argue with the patient, and general ideas to help the patient remain safe in her home environment  . Reviewed medications with patient and discussed compliance. The DIL and son are making sure the patient is taking her medications as prescribed . Collaborated with pcp and CCM team regarding change in patients condition . Discussed plans with patient for ongoing care management follow up and provided patient with direct contact information for care management team . Reviewed scheduled/upcoming provider appointments including: [place] office to call and schedule appointment with the DIL to bring patient in to see pcp.  . Care Guide referral for resources to help the family in the county for dementia and support  . Social Work referral for support and education and resources for advanced dementia patients, placement questions  . Pharmacy referral for medication management, reconciliation and recommendations  Patient Self Care Activities:  . Patient verbalizes understanding of plan to work with the pcp and CCM team for best options in patient with advanced dementia . Attends all scheduled provider appointments . Calls provider office for new concerns or questions . Unable to independently manage care in patient with advanced dementia   Initial goal documentation         Ms. Urbanski was given information about Chronic Care Management services  today including:  1. CCM service includes personalized support from designated clinical staff supervised by her physician, including individualized plan of care and coordination with other care providers 2. 24/7 contact phone numbers for assistance for urgent and routine care needs. 3. Service will only be billed when office clinical staff spend 20 minutes or more in a month to coordinate care. 4. Only one practitioner may furnish and bill the  service in a calendar month. 5. The patient may stop CCM services at any time (effective at the end of the month) by phone call to the office staff. 6. The patient will be responsible for cost sharing (co-pay) of up to 20% of the service fee (after annual deductible is met).  Patient agreed to services and verbal consent obtained.   Plan:   The care management team will reach out to the patient again over the next 30 days.   Noreene Larsson RN, MSN, [place] Mobile: [phone]

## 2019-09-08 ENCOUNTER — Ambulatory Visit: Payer: Self-pay | Admitting: Pharmacist

## 2019-09-08 DIAGNOSIS — I10 Essential (primary) hypertension: Secondary | ICD-10-CM

## 2019-09-08 DIAGNOSIS — F0390 Unspecified dementia without behavioral disturbance: Secondary | ICD-10-CM

## 2019-09-08 DIAGNOSIS — F03A0 Unspecified dementia, mild, without behavioral disturbance, psychotic disturbance, mood disturbance, and anxiety: Secondary | ICD-10-CM

## 2019-09-08 DIAGNOSIS — F0151 Vascular dementia with behavioral disturbance: Secondary | ICD-10-CM | POA: Diagnosis not present

## 2019-09-08 DIAGNOSIS — E039 Hypothyroidism, unspecified: Secondary | ICD-10-CM

## 2019-09-08 NOTE — Patient Instructions (Signed)
Thank you allowing the Chronic Care Management Team to be a part of your care! It was a pleasure speaking with you today!     CCM (Chronic Care Management) Team    Alto Denver RN, MSN, CCM Nurse Care Coordinator  [phone]   Duanne Moron PharmD  Clinical Pharmacist  [phone]   Dickie La LCSW Clinical Social Worker [phone]  Visit Information  Goals Addressed            This Visit's Progress   . PharmD- Med Managment       CARE PLAN ENTRY (see longitudinal plan of care for additional care plan information)   Current Barriers:  . Chronic Disease Management support, education, and care coordination needs related to hypothyroidism, memory loss, HTN, bilateral carotid artery stenosis . Cognitive Deficits - Memory loss  Pharmacist Clinical Goal(s):  Marland Kitchen Over the next 30 days, patient will work with CM Pharmacist to complete medication review and address needs identified  Interventions: . Provider and Inter-disciplinary care team collaboration (see longitudinal plan of care) . Perform chart review . Speak with patient's daughter-in-law/caregiver, Blanca Friend, today who reports she now manages patient's medications after concern of patient taking too many doses/day due to memory loss.  o Reports that she manages and administers all doses of prescription medications from bottles to patient o Encourage caregiver to start using weekly pillbox as adherence tool . Comprehensive medication review performed; medication list updated in electronic medical record o Reports does not currently administer aspirin, calcium or Vitamin D supplement to patient.  - Reports patient does have some OTC supplements in a cabinet at home. States does not believe patient taking any of these on regular basis, but not sure - Reports patient resistant to allowing caregiver to manage OTC supplements. Caregiver hopeful that at upcoming appointment, PCP can advise patient to allow her to manage  these as well for safety . Caregiver states that she will ask patient to bring OTC medication with them to upcoming PCP appointment. o Note 7/15 office visit note from PCP states patient "on ASA 325 mg per neurology". Caregiver states not currently giving to patient as states patient's previous PCP advised her to stop taking aspirin. Caregiver does not recall reason patient advised to stop. o Counsel on importance of consistent administration of levothyroxine dose on empty stomach . Will collaborate with PCP  Patient Self Care Activities:  . Unable to self administer medications as prescribed o Medications managed and administered by patient's daughter-in-law/caregiver  Initial goal documentation        Patient verbalizes understanding of instructions provided today.   Telephone follow up appointment with care management team member scheduled for: 8/27 at 12 pm  Duanne Moron, PharmD, [facility] Clinical Pharmacist [facility] [employer] [phone]

## 2019-09-08 NOTE — Chronic Care Management (AMB) (Signed)
Chronic Care Management   Note  09/08/2019 Name: Kathy Mata MRN: [id number] DOB: 03-Jan-1925   Subjective:   Kathy Mata is a 86 y.o. year old female who is a primary care patient of [name], DO. The CM team was consulted for assistance with chronic disease management and care coordination.   I reached out to patient's caregiver/daughter-in-law, Kathy Mata, by phone today. Note Kathy Mata listed on DPR for patient in chart.  Review of patient status, including review of consultants reports, laboratory and other test data, was performed as part of comprehensive evaluation and provision of chronic care management services.    Objective:  Lab Results  Component Value Date   CREATININE 0.82 06/22/2019   CREATININE 0.8 01/03/2019   CREATININE 0.8 03/09/2017    Lab Results  Component Value Date   HGBA1C 5.5 02/04/2019       Component Value Date/Time   CHOL 179 01/03/2019 0000   TRIG 97 01/03/2019 0000   HDL 65 01/03/2019 0000   LDLCALC 97 01/03/2019 0000     BP Readings from Last 3 Encounters:  08/03/19 (!) 118/53  06/22/19 (!) 144/55    No Known Allergies  Medications Reviewed Today    Reviewed by Daphane Shepherd, RPH (Pharmacist) on 09/08/19 at 1213  Med List Status: <None>  Medication Order Taking? Sig Documenting Provider Last Dose Status Informant  cyanocobalamin 1000 MCG tablet [id number] Yes Take 1 tablet by mouth daily. [provider] Taking Active   donepezil (ARICEPT) 5 MG tablet [id number] Yes Take 1 tablet (5 mg total) by mouth at bedtime. [name], DO Taking Active   escitalopram (LEXAPRO) 10 MG tablet [id number] Yes Take 1 tablet (10 mg total) by mouth daily. [name], DO Taking Active   levothyroxine (SYNTHROID) 75 MCG tablet [id number] Yes Take 1 tablet (75 mcg total) by mouth daily. [name], DO Taking Active   metoprolol succinate (TOPROL-XL) 25 MG 24 hr tablet [id number] Yes Take  1 tablet (25 mg total) by mouth daily. [name], DO Taking Active            Assessment:   Goals Addressed            This Visit's Progress   . PharmD- Med Managment       CARE PLAN ENTRY (see longitudinal plan of care for additional care plan information)   Current Barriers:  . Chronic Disease Management support, education, and care coordination needs related to hypothyroidism, memory loss, HTN, bilateral carotid artery stenosis . Cognitive Deficits - Memory loss  Pharmacist Clinical Goal(s):  Marland Kitchen Over the next 30 days, patient will work with CM Pharmacist to complete medication review and address needs identified  Interventions: . Provider and Inter-disciplinary care team collaboration (see longitudinal plan of care) . Perform chart review . Speak with patient's daughter-in-law/caregiver, Kathy Mata, today who reports she now manages patient's medications after concern of patient taking too many doses/day due to memory loss.  o Reports that she manages and administers all doses of prescription medications from bottles to patient o Encourage caregiver to start using weekly pillbox as adherence tool . Comprehensive medication review performed; medication list updated in electronic medical record o Reports does not currently administer aspirin, calcium or Vitamin D supplement to patient.  - Reports patient does have some OTC supplements in a cabinet at home. States does not believe patient taking any of these on regular basis, but not sure - Reports patient resistant to  allowing caregiver to manage OTC supplements. Caregiver hopeful that at upcoming appointment, PCP can advise patient to allow her to manage these as well for safety . Caregiver states that she will ask patient to bring OTC medication with them to upcoming PCP appointment. o Note 7/15 office visit note from PCP states patient "on ASA 325 mg per neurology". Caregiver states not currently giving to  patient as states patient's previous PCP advised her to stop taking aspirin. Caregiver does not recall reason patient advised to stop. o Counsel on importance of consistent administration of levothyroxine dose on empty stomach . Will collaborate with PCP  Patient Self Care Activities:  . Unable to self administer medications as prescribed o Medications managed and administered by patient's daughter-in-law/caregiver  Initial goal documentation        Plan:  Telephone follow up appointment with care management team member scheduled for: 8/27 at 12 pm  Duanne Moron, PharmD, [facility] Clinical Pharmacist [facility] [phone]

## 2019-09-11 ENCOUNTER — Ambulatory Visit (INDEPENDENT_AMBULATORY_CARE_PROVIDER_SITE_OTHER): Payer: Medicare HMO | Admitting: Family Medicine

## 2019-09-11 ENCOUNTER — Ambulatory Visit: Payer: Self-pay | Admitting: General Practice

## 2019-09-11 ENCOUNTER — Other Ambulatory Visit: Payer: Self-pay

## 2019-09-11 ENCOUNTER — Encounter: Payer: Self-pay | Admitting: Family Medicine

## 2019-09-11 VITALS — BP 122/51 | HR 80 | Temp 97.3°F | Resp 16 | Ht 64.0 in | Wt 96.0 lb

## 2019-09-11 DIAGNOSIS — E039 Hypothyroidism, unspecified: Secondary | ICD-10-CM | POA: Diagnosis not present

## 2019-09-11 DIAGNOSIS — I6521 Occlusion and stenosis of right carotid artery: Secondary | ICD-10-CM

## 2019-09-11 DIAGNOSIS — I739 Peripheral vascular disease, unspecified: Secondary | ICD-10-CM

## 2019-09-11 DIAGNOSIS — F0151 Vascular dementia with behavioral disturbance: Secondary | ICD-10-CM | POA: Diagnosis not present

## 2019-09-11 DIAGNOSIS — F418 Other specified anxiety disorders: Secondary | ICD-10-CM | POA: Diagnosis not present

## 2019-09-11 DIAGNOSIS — I10 Essential (primary) hypertension: Secondary | ICD-10-CM

## 2019-09-11 DIAGNOSIS — F0390 Unspecified dementia without behavioral disturbance: Secondary | ICD-10-CM

## 2019-09-11 DIAGNOSIS — F01518 Vascular dementia, unspecified severity, with other behavioral disturbance: Secondary | ICD-10-CM

## 2019-09-11 DIAGNOSIS — F03A0 Unspecified dementia, mild, without behavioral disturbance, psychotic disturbance, mood disturbance, and anxiety: Secondary | ICD-10-CM

## 2019-09-11 MED ORDER — TRAZODONE HCL 50 MG PO TABS: 50.0000 mg | ORAL_TABLET | Freq: Every day | ORAL | 2 refills | Status: AC

## 2019-09-11 MED ORDER — DONEPEZIL HCL 10 MG PO TABS: 10.0000 mg | ORAL_TABLET | Freq: Every day | ORAL | 2 refills | Status: AC

## 2019-09-11 NOTE — Patient Instructions (Signed)
Visit Information  Goals Addressed              This Visit's Progress     RNCM: Chronic Disease Management and Care coordination Needs        CARE PLAN ENTRY (see longtitudinal plan of care for additional care plan information)  Current Barriers:   Chronic Disease Management support, education, and care coordination needs related to HTN, carotid artery stenosis, PAD, and Hypothyroidism   Clinical Goal(s) related to HTN, carotid artery stenosis, PAD, and Hypothyroidism :  Over the next 120 days, patient will:   Work with the care management team to address educational, disease management, and care coordination needs   Begin or continue self health monitoring activities as directed today  adhere to a heart healthy diet  Call provider office for new or worsened signs and symptoms Shortness of breath and New or worsened symptom related to HTN/PAD/Hypothyroidism/carotid artery stenosis and other chronic conditions   Call care management team with questions or concerns  Verbalize basic understanding of patient centered plan of care established today  Interventions related to HTN, carotid artery stenosis, PAD, and Hypothyroidism :   Evaluation of current treatment plans and patient's adherence to plan as established by provider  Assessed patient understanding of disease states.  The patient does not understand her chronic conditions but has a good support system from her DIL and son. They manage her medications, appointments and changes.   Assessed patient's education and care coordination needs.  The patient is currently living by herself however it was discussed today the need to see about finding a safe place for the patient to move. The DIL and son are at a point where they don't know what to do.   Provided disease specific education to patient.  09-11-2019: After visit summary reviewed with the patients daughter in law [place]. Medication changes have been reviewed and [name] verbalized  understanding.   Collaborated with appropriate clinical care team members regarding patient needs.  Pharm D has talked with the DIL, [name]. [name] had all medications with her today. She will dispose of the ones that Dr. Althea Charon recommended to discontinue. LCSW consulted today and review of the patient needs. The LCSW has moved her appointment up to speak to the DIL tomorrow by phone. Explained the CCM team and working with the pcp, patient and family to meet the patients needs.   Patient Self Care Activities related to {HTN, carotid artery stenosis, PAD, and Hypothyroidism :   Patient is unable to independently self-manage chronic health conditions  Please see past updates related to this goal by clicking on the "Past Updates" button in the selected goal        RNCM: Pt's DIL-"Things are not going well" (pt-stated)        CARE PLAN ENTRY (see longitudinal plan of care for additional care plan information)  Current Barriers:   Knowledge Deficits related to patient with advanced dementia currently living alone, family seeking guidance on best course of action  Care Coordination needs related to ADL/IADLS needs in a patient with dementia  (disease states)  Chronic Disease Management support and education needs related to patient with Dementia and family verbalizing the patient is not doing well in her surroundings (example calling son and DIL 41 times)  Lacks caregiver support.   Corporate treasurer.   Cognitive Deficits  Nurse Case Manager Clinical Goal(s):   Over the next 120 days, patient will verbalize understanding of plan for resources and treatment options for  patient with advancing dementia  Over the next 120 days, patient will work with RNCM, CCM team, and pcp to address needs related to declining memory and dementia in elderly patient  Over the next 120 days, patient will attend all scheduled medical appointments: front office staff to call the patients daughter in law to  set up an appointment to see pcp  Over the next 120 days, patient will demonstrate improved adherence to prescribed treatment plan for dementia  as evidenced bycompliance with medications, safe living arrangements, and support for caregivers  Over the next 120 days, patient will work with CM team pharmacist to for medication review, medication reconciliation, and possible medication advice for patient with advanced dementia  Over the next 120 days, patient will work with CM clinical social worker to caregiver strain, placement questions and help, and support for patient with advancing dementia  Over the next 120 days, patient will work with care guides for resources for sitter information and other needs  (community agency) to help with patient with advancing dementia  Interventions:   Inter-disciplinary care team collaboration (see longitudinal plan of care)  Evaluation of current treatment plan related to demenita and patient's adherence to plan as established by provider. 09-11-2019: Face to face visit with the pcp, RNCM, patient and DIL [name] today to discuss the patients decline in memory and how to best facility safe and effective care for the patient and moving forward with a plan that will keep the patient safe with her advanced dementia.   Advised patient to DIL to work with the CCM team to meet patient needs. Education provided on getting an appointment to see the pcp for follow up and recommendations. 09-11-2019: The patient will follow up with pcp in about 4 weeks to evaluate medication changes made today. Next appointment with pcp on 10-10-2019 at 1:20 pm  Provided education to patient re: safety in the patient home, not trying to dispute the patient or argue with the patient, and general ideas to help the patient remain safe in her home environment. 09-11-2019: Educational material given to the DIL on places in the area for research and discussion with the patients son for placement with  dementia care available. The DIL is concerned as they have not filled out any paperwork. Co collaboration with the LCSW to call the DIL sooner to assist with placement questions and recommendations. After summary visit reviewed with [name] and information provided for each CCM team member with contact information. Also ask [name] to bring a copy of AD/LW and healthcare poa information so we can scan and have on file. Have also ask [name] to supply the immunization information so the record could be updated. The patient has had both COVID19 vaccines first in March and the second in April.    Reviewed medications with patient and discussed compliance. The DIL and son are making sure the patient is taking her medications as prescribed.  09-11-2019: The pcp made changes in the medications today. Review with the DIL today and explained the changes as outlined on the after visit summary.   Collaborated with pcp and CCM team regarding change in patients condition.  The pharm D has talked with the DIL, and LCSW will talk to the DIL via phone tomorrow.   Discussed plans with patient for ongoing care management follow up and provided patient with direct contact information for care management team  Reviewed scheduled/upcoming provider appointments including: Next appointment with the pcp on 10-10-2019 at 120pm   Care  Guide referral for resources to help the family in the county for dementia and support   Social Work referral for support and education and resources for advanced dementia patients, placement questions   Pharmacy referral for medication management, reconciliation and recommendations  Supplied the DIL with LTC facility list for [facility] and also a life alert information paper for review.   Patient Self Care Activities:   Patient verbalizes understanding of plan to work with the pcp and CCM team for best options in patient with advanced dementia  Attends all scheduled provider  appointments  Calls provider office for new concerns or questions  Unable to independently manage care in patient with advanced dementia   Please see past updates related to this goal by clicking on the "Past Updates" button in the selected goal         Patient verbalizes understanding of instructions provided today.   Telephone follow up appointment with care management team member scheduled for: 10-05-2019  Alto Denver RN, MSN, CCM Community Care Coordinator [facility]   [facility] [place] Mobile: [phone]

## 2019-09-11 NOTE — Chronic Care Management (AMB) (Signed)
Chronic Care Management   Follow Up Note   09/11/2019 Name: Kathy Mata MRN: [id number] DOB: May 25, 1924  Referred by: [name], DO Reason for referral : Chronic Care Management (Face to Face visit with the patient and the patient DIL at the provider visit- Chronic Disease Management and Care Coordination Needs)   Kathy Mata is a 86 y.o. year old female who is a primary care patient of [name], DO. The CCM team was consulted for assistance with chronic disease management and care coordination needs.    Review of patient status, including review of consultants reports, relevant laboratory and other test results, and collaboration with appropriate care team members and the patient's provider was performed as part of comprehensive patient evaluation and provision of chronic care management services.    SDOH (Social Determinants of Health) assessments performed: Yes See Care Plan activities for detailed interventions related to [facility])     Outpatient Encounter Medications as of 09/11/2019  Medication Sig  . donepezil (ARICEPT) 10 MG tablet Take 1 tablet (10 mg total) by mouth at bedtime.  Marland Kitchen levothyroxine (SYNTHROID) 75 MCG tablet Take 1 tablet (75 mcg total) by mouth daily.  . metoprolol succinate (TOPROL-XL) 25 MG 24 hr tablet Take 1 tablet (25 mg total) by mouth daily.  . traZODone (DESYREL) 50 MG tablet Take 1 tablet (50 mg total) by mouth at bedtime.   No facility-administered encounter medications on file as of 09/11/2019.     Objective:  BP Readings from Last 3 Encounters:  09/11/19 (!) 122/51  08/03/19 (!) 118/53  06/22/19 (!) 144/55    Goals Addressed              This Visit's Progress   .  RNCM: Chronic Disease Management and Care coordination Needs        CARE PLAN ENTRY (see longtitudinal plan of care for additional care plan information)  Current Barriers:  . Chronic Disease Management support, education, and care coordination  needs related to HTN, carotid artery stenosis, PAD, and Hypothyroidism   Clinical Goal(s) related to HTN, carotid artery stenosis, PAD, and Hypothyroidism :  Over the next 120 days, patient will:  . Work with the care management team to address educational, disease management, and care coordination needs  . Begin or continue self health monitoring activities as directed today  adhere to a heart healthy diet . Call provider office for new or worsened signs and symptoms Shortness of breath and New or worsened symptom related to HTN/PAD/Hypothyroidism/carotid artery stenosis and other chronic conditions  . Call care management team with questions or concerns . Verbalize basic understanding of patient centered plan of care established today  Interventions related to HTN, carotid artery stenosis, PAD, and Hypothyroidism :  . Evaluation of current treatment plans and patient's adherence to plan as established by provider . Assessed patient understanding of disease states.  The patient does not understand her chronic conditions but has a good support system from her DIL and son. They manage her medications, appointments and changes.  . Assessed patient's education and care coordination needs.  The patient is currently living by herself however it was discussed today the need to see about finding a safe place for the patient to move. The DIL and son are at a point where they don't know what to do.  . Provided disease specific education to patient.  09-11-2019: After visit summary reviewed with the patients daughter in law Kathy Mata. Medication changes have been reviewed and Kathy Mata verbalized  understanding.  [name] with appropriate clinical care team members regarding patient needs.  Pharm D has talked with the DIL, Kathy Mata. Kathy Mata had all medications with her today. She will dispose of the ones that Dr. Althea Charon recommended to discontinue. LCSW consulted today and review of the patient needs. The LCSW has moved her  appointment up to speak to the DIL tomorrow by phone. Explained the CCM team and working with the pcp, patient and family to meet the patients needs.   Patient Self Care Activities related to {HTN, carotid artery stenosis, PAD, and Hypothyroidism :  . Patient is unable to independently self-manage chronic health conditions  Please see past updates related to this goal by clicking on the "Past Updates" button in the selected goal      .  RNCM: Pt's DIL-"Things are not going well" (pt-stated)        CARE PLAN ENTRY (see longitudinal plan of care for additional care plan information)  Current Barriers:  Marland Kitchen Knowledge Deficits related to patient with advanced dementia currently living alone, family seeking guidance on best course of action . Care Coordination needs related to ADL/IADLS needs in a patient with dementia  (disease states) . Chronic Disease Management support and education needs related to patient with Dementia and family verbalizing the patient is not doing well in her surroundings (example calling son and DIL 41 times) . Lacks caregiver support.  . Corporate treasurer.  . Cognitive Deficits  Nurse Case Manager Clinical Goal(s):  Marland Kitchen Over the next 120 days, patient will verbalize understanding of plan for resources and treatment options for patient with advancing dementia . Over the next 120 days, patient will work with [facility], CCM team, and pcp to address needs related to declining memory and dementia in elderly patient . Over the next 120 days, patient will attend all scheduled medical appointments: front office staff to call the patients daughter in law to set up an appointment to see pcp . Over the next 120 days, patient will demonstrate improved adherence to prescribed treatment plan for dementia  as evidenced bycompliance with medications, safe living arrangements, and support for caregivers . Over the next 120 days, patient will work with CM team pharmacist to for medication  review, medication reconciliation, and possible medication advice for patient with advanced dementia . Over the next 120 days, patient will work with CM clinical social worker to caregiver strain, placement questions and help, and support for patient with advancing dementia . Over the next 120 days, patient will work with care guides for resources for sitter information and other needs  (community agency) to help with patient with advancing dementia  Interventions:  . Inter-disciplinary care team collaboration (see longitudinal plan of care) . Evaluation of current treatment plan related to demenita and patient's adherence to plan as established by provider. 09-11-2019: Face to face visit with the pcp, RNCM, patient and DIL Kathy Mata today to discuss the patients decline in memory and how to best facility safe and effective care for the patient and moving forward with a plan that will keep the patient safe with her advanced dementia.  . Advised patient to DIL to work with the CCM team to meet patient needs. Education provided on getting an appointment to see the pcp for follow up and recommendations. 09-11-2019: The patient will follow up with pcp in about 4 weeks to evaluate medication changes made today. Next appointment with pcp on 10-10-2019 at 1:20 pm . Provided education to patient re: safety in the  patient home, not trying to dispute the patient or argue with the patient, and general ideas to help the patient remain safe in her home environment. 09-11-2019: Educational material given to the DIL on places in the area for research and discussion with the patients son for placement with dementia care available. The DIL is concerned as they have not filled out any paperwork. Co collaboration with the LCSW to call the DIL sooner to assist with placement questions and recommendations. After summary visit reviewed with Kathy Mata and information provided for each CCM team member with contact information. Also ask Kathy Mata to  bring a copy of AD/LW and healthcare poa information so we can scan and have on file. Have also ask Kathy Mata to supply the immunization information so the record could be updated. The patient has had both COVID19 vaccines first in March and the second in April.   . Reviewed medications with patient and discussed compliance. The DIL and son are making sure the patient is taking her medications as prescribed.  09-11-2019: The pcp made changes in the medications today. Review with the DIL today and explained the changes as outlined on the after visit summary.  Marland Kitchen Collaborated with pcp and CCM team regarding change in patients condition.  The pharm D has talked with the DIL, and LCSW will talk to the DIL via phone tomorrow.  . Discussed plans with patient for ongoing care management follow up and provided patient with direct contact information for care management team . Reviewed scheduled/upcoming provider appointments including: Next appointment with the pcp on 10-10-2019 at 120pm  . Care Guide referral for resources to help the family in the county for dementia and support  . Social Work referral for support and education and resources for advanced dementia patients, placement questions  . Pharmacy referral for medication management, reconciliation and recommendations . Supplied the DIL with LTC facility list for [facility] and also a life alert information paper for review.   Patient Self Care Activities:  . Patient verbalizes understanding of plan to work with the pcp and CCM team for best options in patient with advanced dementia . Attends all scheduled provider appointments . Calls provider office for new concerns or questions . Unable to independently manage care in patient with advanced dementia   Please see past updates related to this goal by clicking on the "Past Updates" button in the selected goal          Plan:   Telephone follow up appointment with care management team member  scheduled for: 10-05-2019   Alto Denver RN, MSN, CCM Community Care Coordinator [place]  [facility] [place] Mobile: [phone]

## 2019-09-11 NOTE — Progress Notes (Signed)
Subjective:    Patient ID: Kathy Mata, female    DOB: 30-Jan-1924, 86 y.o.   MRN: [id number]  Kathy Mata is a 86 y.o. female presenting on 09/11/2019 for No chief complaint on file.  Accompanied by daughter in law, Blanca Friend HCPOA. Patient is unable to provide history due to dementia.  HPI   Hypothyroidism Last visit had labs 06/2019, has been stable on Levothyroxine daily now. With normal TSH and Free T4. No new concerns on this topic today.  Dementia Chronic progressive problem. See initial intake history 06/22/19 Interval update, reviewed records from [facility] Reviewed prior imaging MRI Brain. She has known vascular history with PAD, Carotid stenosis, HTN, suspected type of dementia has been Vascular Dementia She has been connected with our CCM team recently, and family reached out recently after further decline in symptoms and her functioning and they are concerned, now here today to follow-up on next steps. They are interested in Memory Care facility and have difficulty with managing her at home, need more caregiver support. - Patient has been taking her supplements vitamins but unsure how often or how many per dose - There was concern for med safety with taking her meds - she has tried Escitalopram 10mg  for agitation with inadequate results. - She has been on Aricept 5mg  daily without significant improvement. - No wandering episodes - Some abnormal behavior with verbal swearing and yelling / arguing, confused with items in the home unsure where they came from, caused some paranoia   Depression screen [facility] 2/9 08/03/2019 06/22/2019  Decreased Interest 0 0  Down, Depressed, Hopeless 0 0  PHQ - 2 Score 0 0    Social History   Tobacco Use  . Smoking status: Never Smoker  . Smokeless tobacco: Never Used  Substance Use Topics  . Alcohol use: Never  . Drug use: Never    Review of Systems Per HPI unless specifically indicated above     Objective:    BP (!)  122/51   Pulse 80   Temp (!) 97.3 F (36.3 C) (Temporal)   Resp 16   Ht 5\' 4"  (1.626 m)   Wt 96 lb (43.5 kg)   SpO2 99%   BMI 16.48 kg/m   Wt Readings from Last 3 Encounters:  09/11/19 96 lb (43.5 kg)  08/03/19 100 lb (45.4 kg)  06/22/19 103 lb 9.6 oz (47 kg)    Physical Exam Vitals and nursing note reviewed.  Constitutional:      General: She is not in acute distress.    Appearance: She is well-developed. She is not diaphoretic.     Comments: Currently well appearing 86 year old elderly female, comfortable, cooperative  HENT:     Head: Normocephalic and atraumatic.  Eyes:     General:        Right eye: No discharge.        Left eye: No discharge.     Conjunctiva/sclera: Conjunctivae normal.  Neck:     Thyroid: No thyromegaly.  Cardiovascular:     Rate and Rhythm: Normal rate and regular rhythm.     Heart sounds: Normal heart sounds. No murmur heard.   Pulmonary:     Effort: Pulmonary effort is normal. No respiratory distress.     Breath sounds: Normal breath sounds. No wheezing or rales.  Musculoskeletal:        General: Normal range of motion.     Cervical back: Normal range of motion and neck supple.  Lymphadenopathy:  Cervical: No cervical adenopathy.  Skin:    General: Skin is warm and dry.     Findings: No erythema or rash.  Neurological:     Mental Status: She is alert. Mental status is at baseline.     Sensory: No sensory deficit.     Motor: No weakness.     Comments: Oriented to person only  Psychiatric:        Behavior: Behavior normal.     Comments: Well groomed, good eye contact, normal speech and thoughts but difficulty participating in conversation due to poor hearing and cognition. She is able to respond appropriately when asked questions and follows most commands, she has difficulty answering more complex questions due to cognitive processing and exhibits a lot of word finding or misunderstanding and incomplete answers. She is frequently  repeating phrases and responses. Very poor insight.       Results for orders placed or performed in visit on 07/16/19  Basic metabolic panel  Result Value Ref Range   Glucose 112    BUN 18 4 - 21   CO2 22 13 - 22   Creatinine 0.9 0.5 - 1.1   Potassium 4.4 3.4 - 5.3   Sodium 142 137 - 147   Chloride 104 99 - 108  Comprehensive metabolic panel  Result Value Ref Range   Globulin 2    GFR calc non Af Amer 57    Calcium 9.3 8.7 - 10.7   Albumin 4.1 3.5 - 5.0  Hepatic function panel  Result Value Ref Range   Alkaline Phosphatase 56 25 - 125   ALT 13 7 - 35   AST 18 13 - 35   Bilirubin, Total 0.6   Lipid panel  Result Value Ref Range   Triglycerides 140 40 - 160   Cholesterol 203 (A) 0 - 200   HDL 68 35 - 70   LDL Cholesterol 107   VITAMIN D 25 Hydroxy (Vit-D Deficiency, Fractures)  Result Value Ref Range   Vit D, 25-Hydroxy 28.6   Basic metabolic panel  Result Value Ref Range   Glucose 99    BUN 16 4 - 21   CO2 23 (A) 13 - 22   Creatinine 0.8 0.5 - 1.1   Potassium 4.9 3.4 - 5.3   Sodium 139 137 - 147   Chloride 105 99 - 108  Comprehensive metabolic panel  Result Value Ref Range   Globulin 2.2    GFR calc non Af Amer 65    Calcium 9.8 8.7 - 10.7   Albumin 4.3 3.5 - 5.0  TSH  Result Value Ref Range   TSH 0.77 0.41 - 5.90      Assessment & Plan:   Problem List Items Addressed This Visit    Vascular dementia with behavior disturbance (HCC) - Primary   Relevant Medications   donepezil (ARICEPT) 10 MG tablet   traZODone (DESYREL) 50 MG tablet   Depression with anxiety    See A&P dementia  Taper off SSRI Escitalopram 10mg , dosing per AVS handout taper over 4 weeks approximately, since ineffective  Add Trazodone nightly for improve sleep if possible and mood stabilization/agitation      Relevant Medications   traZODone (DESYREL) 50 MG tablet   Acquired hypothyroidism    Continue current levothyroxine daily, no change         #Dementia Likely  Vascular in etiology given her history PAD, Carotid Stenosis Currently with further decline, with history of progressivecognitive decline  memory loss vs new concern fordementia, clinically FAST Stage5 to possibly stage 6 approximately(needs help with ADLs) Currently no active wandering or other immediate safety concerns, biggest concern today was with med rec and may have been taking supplements inappropriately Reviewed prior Neurology evaluation including MRI.  Increased caregiver support and need at this time. Inadequate coverage/supervision at home and not safe to live independently  Limited results on Aricept 5mg  Still has behavioral agitation symptoms, limited improve on SSRI escitalopram  Plan: 1. Reviewed Dementia prognosis and management, explained that may need additional support in future. She has already been referred to our CCM team and they are working with her at this time. Corresponded with the team earlier today and will coordinate after visit today - She will see RN - nurse case manager today in office after my visit with patient today, she has resource list on locations for Memory Care Facility that may be options for her - Phone call with [place] will be moved up til tomorrow - Will coordinate with [facility] for her medications  Increase Aricept from 5 to 10mg  Add Trazodone 50mg  nightly to help with maintain sleep wake cycle and limit agitation Taper off SSRI Escitalopram from 10mg  off over next 2-4 weeks, taper instructions per AVS  DISCONTINUE ALL supplements / vitamins - except may take a Multivitamin once daily for now. Patient argumentative when discontinued her vitamins, however concern with inadequately or improperly taking, will DC the Fish Oil, Vitamin C, Vitamin D3, Calcium for now.  Anticipate will need to complete FL2 soon once location is identified if they pursue a memory care facility.  Meds ordered this encounter  Medications  .  donepezil (ARICEPT) 10 MG tablet    Sig: Take 1 tablet (10 mg total) by mouth at bedtime.    Dispense:  30 tablet    Refill:  2  . traZODone (DESYREL) 50 MG tablet    Sig: Take 1 tablet (50 mg total) by mouth at bedtime.    Dispense:  30 tablet    Refill:  2      Follow up plan: Return in about 4 weeks (around 10/09/2019) for Follow-up 4 weeks Dementia / Mood / Sleep meds.   , DO [facility] 09/11/2019, 2:21 PM

## 2019-09-11 NOTE — Patient Instructions (Addendum)
Thank you for coming to the office today.  Medication Taper Off Instructions Current med - Escitalopram 10mg   Week 1-2: REDUCE down to 5mg  (HALF tablet)  Week 3-4: Take 5mg  every OTHER day (HALF tablet) - alternating day is NO medicine (skip dose) STOP  completely  INCREASE DOSE Aricept from 5 to 10mg , new rx sent. At bedtime  START new Trazodone 50mg  nightly before bed as well for agitation and sleep.  ONLY take Multivitamin DAILY, stop other supplements.  We will need to get involved to help link up with a Memory Care Facility  Please schedule a Follow-up Appointment to: Return in about 4 weeks (around 10/09/2019) for Follow-up 4 weeks Dementia / Mood / Sleep meds.  If you have any other questions or concerns, please feel free to call the office or send a message through MyChart. You may also schedule an earlier appointment if necessary.  Additionally, you may be receiving a survey about your experience at our office within a few days to 1 week by e-mail or mail. We value your feedback.  , DO [facility], 

## 2019-09-11 NOTE — Assessment & Plan Note (Signed)
Continue current levothyroxine daily, no change

## 2019-09-11 NOTE — Assessment & Plan Note (Addendum)
See A&P dementia  Taper off SSRI Escitalopram 10mg , dosing per AVS handout taper over 4 weeks approximately, since ineffective  Add Trazodone nightly for improve sleep if possible and mood stabilization/agitation

## 2019-09-11 NOTE — Telephone Encounter (Signed)
Discuss at apt today  Saralyn Pilar, DO [facility] 09/11/2019, 12:19 PM

## 2019-09-12 ENCOUNTER — Ambulatory Visit: Payer: Self-pay | Admitting: Licensed Clinical Social Worker

## 2019-09-12 DIAGNOSIS — I739 Peripheral vascular disease, unspecified: Secondary | ICD-10-CM

## 2019-09-12 DIAGNOSIS — F0390 Unspecified dementia without behavioral disturbance: Secondary | ICD-10-CM

## 2019-09-12 DIAGNOSIS — F01518 Vascular dementia, unspecified severity, with other behavioral disturbance: Secondary | ICD-10-CM

## 2019-09-12 DIAGNOSIS — F0151 Vascular dementia with behavioral disturbance: Secondary | ICD-10-CM

## 2019-09-12 DIAGNOSIS — F03A0 Unspecified dementia, mild, without behavioral disturbance, psychotic disturbance, mood disturbance, and anxiety: Secondary | ICD-10-CM

## 2019-09-12 DIAGNOSIS — I10 Essential (primary) hypertension: Secondary | ICD-10-CM

## 2019-09-12 NOTE — Chronic Care Management (AMB) (Signed)
Chronic Care Management    Clinical Social Work Follow Up Note  09/12/2019 Name: Kathy Mata MRN: [id number] DOB: [birth year]  Kathy Mata is a 86 y.o. year old female who is a primary care patient of [name], DO. The CCM team was consulted for assistance with Level of Care Concerns.   Review of patient status, including review of consultants reports, other relevant assessments, and collaboration with appropriate care team members and the patient's provider was performed as part of comprehensive patient evaluation and provision of chronic care management services.    SDOH (Social Determinants of Health) assessments performed: Yes    Outpatient Encounter Medications as of 09/12/2019  Medication Sig  . donepezil (ARICEPT) 10 MG tablet Take 1 tablet (10 mg total) by mouth at bedtime.  Marland Kitchen levothyroxine (SYNTHROID) 75 MCG tablet Take 1 tablet (75 mcg total) by mouth daily.  . metoprolol succinate (TOPROL-XL) 25 MG 24 hr tablet Take 1 tablet (25 mg total) by mouth daily.  . Multiple Vitamin (MULTIVITAMIN WITH MINERALS) TABS tablet Take 1 tablet by mouth daily.  . traZODone (DESYREL) 50 MG tablet Take 1 tablet (50 mg total) by mouth at bedtime.   No facility-administered encounter medications on file as of 09/12/2019.     Goals Addressed    .  SW- "We want ALF placement for [place]" (pt-stated)        CARE PLAN ENTRY (see longitudinal plan of care for additional care plan information)  Current Barriers:  . Limited social support . Level of care concerns . Mental Health Concerns  . Social Isolation . Limited access to caregiver . Cognitive Deficits . Memory Deficits  Clinical Social Work Clinical Goal(s):   Marland Kitchen Over the next 120 days, patient/caregiver will work with SW to address concerns related to lack of support/resource connection and need for ALF placement. LCSW will assist patient/caregiver in gaining additional support/resource connection and community resource  education in order to maintain health and mental health appropriately  . Over the next 120 days, family will apply for Special Assistance Medicaid. . Over the next 120 days, patient will demonstrate improved health management independence as evidenced by implementing healthy self-care skills and positive support/resources into her daily routine to help cope with stressors and improve overall health and well-being  . Over the next 120 days, patient or caregiver will verbalize basic understanding of depression/stress process and self health management plan as evidenced by her participation in development of long term plan of care and institution of self health management strategies  Interventions: . Inter-disciplinary care team collaboration (see longitudinal plan of care) . Patient interviewed and appropriate assessments performed. Patient is in need of ALF placement. LCSW provided [name] (caregiver) with extensive education on how long term care placement, Medicaid enrollment and their financial requirements and steps to transfer Medicaid if ever needed. CCM RNCM has already hand delivered a list of ALF's within [facility]. Patient ask if LCSW can email and mail all resource information to patient. LCSW will send resources out today on 09/12/19 . Provided mental health counseling with regard to anxiety management. Patient continues to express great anxiety during 10 am-3 pm everyday. LCSW provided education on how patient can effectively manage those symptoms throughout the day better.  . Discussed plans with patient for ongoing care management follow up and provided patient with direct contact information for care management team . Advised patient/family to go to DSS to apply for Special Assistance Medicaid. Family wish to talk with their attorney first  as patient has a trust that she is unable to touch or use for LTC placement expenses.  . Family will start to complete tours at local ALF's and will  notify PCP office once they have found the facility of their choosing in order to start FL2.  [name] with primary care provider re: LTC placement and Medication concerns. . CCM team updated on family's decision to start ALF placement at this time.  . Assisted patient/caregiver with obtaining information about health plan benefits . Provided education and assistance to client regarding Advanced Directives. . Provided education to patient/caregiver regarding level of care options. . Provided education to patient/caregiver about Hospice and/or Palliative Care services . Caregiver was wondering if patient could take Trazodone in the morning or during the day as patient is experiencing the most anxiety from 10 am - 3 pm. Patient is lashing out the most on caregiver during this time as well. Per PCP, Regarding the medication change request. I would not change the current medication again, we just changed it yesterday. We are tapering off the Escitalopram, and switching to the Trazodone. The idea with the trazodone was to promote better sleep and improve the mood, it should provide some benefit throughout the day, not just only at bedtime only. I would advise against adding a PRN medicine for agitation/anxiety at this time - this can be complicated on when to use those medicines and most of them are not without risk. For right now, I would give the new rx Trazodone a chance, it will take several days to weeks to have full benefit. In the future, we may consider using a 2nd dose of Trazodone as a PRN in afternoon for agitation but I would not recommend starting that yet since we are just starting this medicine now. I think at some point a symptom management from a Palliative or other input may be beneficial, but medicating for anxiety is not always the easiest or preferred option. Family was updated and is agreeable ot this plan.   Patient Self Care Activities:  . Attends all scheduled provider  appointments . Lacks social connections  Initial goal documentation      Follow Up Plan: SW will follow up with patient by phone over the next 14 days  Dickie La, [employer], MSW, LCSW [facility]  [facility] [email or web address] Phone: [phone]

## 2019-09-15 ENCOUNTER — Ambulatory Visit: Payer: Medicare HMO | Admitting: Pharmacist

## 2019-09-15 DIAGNOSIS — E039 Hypothyroidism, unspecified: Secondary | ICD-10-CM

## 2019-09-15 DIAGNOSIS — F0390 Unspecified dementia without behavioral disturbance: Secondary | ICD-10-CM | POA: Diagnosis not present

## 2019-09-15 DIAGNOSIS — I10 Essential (primary) hypertension: Secondary | ICD-10-CM | POA: Diagnosis not present

## 2019-09-15 DIAGNOSIS — F0151 Vascular dementia with behavioral disturbance: Secondary | ICD-10-CM

## 2019-09-15 DIAGNOSIS — F01518 Vascular dementia, unspecified severity, with other behavioral disturbance: Secondary | ICD-10-CM

## 2019-09-15 NOTE — Patient Instructions (Signed)
Thank you allowing the Chronic Care Management Team to be a part of your care! It was a pleasure speaking with you today!     CCM (Chronic Care Management) Team    Alto [place] RN, MSN, CCM Nurse Care Coordinator  [phone]   Duanne Moron PharmD  Clinical Pharmacist  [phone]   Dickie La LCSW Clinical Social Worker [phone]  Visit Information  Goals Addressed            This Visit's Progress   . PharmD- Med Managment       CARE PLAN ENTRY (see longitudinal plan of care for additional care plan information)   Current Barriers:  . Chronic Disease Management support, education, and care coordination needs related to hypothyroidism, memory loss, HTN, bilateral carotid artery stenosis . Cognitive Deficits - Memory loss  Pharmacist Clinical Goal(s):  Marland Kitchen Over the next 30 days, patient will work with CM Pharmacist to complete medication review and address needs identified  Interventions: . Provider and Inter-disciplinary care team collaboration (see longitudinal plan of care) . Collaborated with PCP regarding medication management . Patient seen by PCP in office on 8/23.  o Discussed need for additional support/memory care facility. o Patient advised to: - Taper off of escitalopram, instructions provided: . Week 1-2: REDUCE down to 5mg  (HALF tablet)  . Week 3-4: Take 5mg  every OTHER day (HALF tablet) - alternating day is NO medicine (skip dose) . Then: STOP  completely - Increase Aricept from 5 mg to 10 mg daily - Start trazodone nightly for improved sleep and to limit agitation - Discontinue all supplements/vitamins except multivitamin once daily  . Follow up with patient's daughter-in-law/caregiver, , today who reports patient and family have been discussing touring facilities to consider for patient . Follow up regarding medication adherence o Reports now all medications, including OTC items, have been collected and caregiver manages and  administers medications to patient o Reports has adjusted patient's medication regimen as directed by PCP o Verbalizes understanding via teach back provider's instructions for tapering patient off of escitalopram o Counsel on importance of separating administration of multivitamin from levothyroxine by at least 4 hours o Review importance of consistent administration of levothyroxine dose on empty stomach . Caregiver asks about possible food resource to help with providing meals to patient, particularly to aid with lunchtime meal o Will send referral to care guide team requesting outreach to caregiver about community food resources  Patient Self Care Activities:  . Unable to self administer medications as prescribed o Medications managed and administered by patient's daughter-in-law/caregiver  Please see past updates related to this goal by clicking on the "Past Updates" button in the selected goal         Caregiver verbalizes understanding of instructions provided today.   Telephone follow up appointment with care management team member scheduled for: 10/13 at 9:45 am  Blanca Friend, PharmD, [facility] Clinical Pharmacist [facility] [phone]

## 2019-09-15 NOTE — Chronic Care Management (AMB) (Signed)
Chronic Care Management   Follow Up Note   09/15/2019 Name: Kathy Mata MRN: [id number] DOB: [birth year]  Referred by: [name], DO Reason for referral : Chronic Care Management (Patient Phone Call)   Kathy Mata is a 86 y.o. year old female who is a primary care patient of [name], DO. The CCM team was consulted for assistance with chronic disease management and care coordination needs.    I reach out to patient's caregiver/daughter-in-law, Blanca Friend, by phone today. Note Blanca Friend listed on DPR for patient in chart.  Review of patient status, including review of consultants reports, relevant laboratory and other test results, and collaboration with appropriate care team members and the patient's provider was performed as part of comprehensive patient evaluation and provision of chronic care management services.    SDOH (Social Determinants of Health) assessments performed: Yes See Care Plan activities for detailed interventions related to [facility])     Outpatient Encounter Medications as of 09/15/2019  Medication Sig  . donepezil (ARICEPT) 10 MG tablet Take 1 tablet (10 mg total) by mouth at bedtime.  Marland Kitchen levothyroxine (SYNTHROID) 75 MCG tablet Take 1 tablet (75 mcg total) by mouth daily.  . metoprolol succinate (TOPROL-XL) 25 MG 24 hr tablet Take 1 tablet (25 mg total) by mouth daily.  . Multiple Vitamin (MULTIVITAMIN WITH MINERALS) TABS tablet Take 1 tablet by mouth daily.  . traZODone (DESYREL) 50 MG tablet Take 1 tablet (50 mg total) by mouth at bedtime.   No facility-administered encounter medications on file as of 09/15/2019.    Goals Addressed            This Visit's Progress   . PharmD- Med Managment       CARE PLAN ENTRY (see longitudinal plan of care for additional care plan information)   Current Barriers:  . Chronic Disease Management support, education, and care coordination needs related to hypothyroidism, memory loss, HTN, bilateral  carotid artery stenosis . Cognitive Deficits - Memory loss  Pharmacist Clinical Goal(s):  Marland Kitchen Over the next 30 days, patient will work with CM Pharmacist to complete medication review and address needs identified  Interventions: . Provider and Inter-disciplinary care team collaboration (see longitudinal plan of care) . Collaborated with PCP regarding medication management . Patient seen by PCP in office on 8/23.  o Discussed need for additional support/memory care facility. o Patient advised to: - Taper off of escitalopram, instructions provided: . Week 1-2: REDUCE down to 5mg  (HALF tablet)  . Week 3-4: Take 5mg  every OTHER day (HALF tablet) - alternating day is NO medicine (skip dose) . Then: STOP  completely - Increase Aricept from 5 mg to 10 mg daily - Start trazodone nightly for improved sleep and to limit agitation - Discontinue all supplements/vitamins except multivitamin once daily  . Follow up with patient's daughter-in-law/caregiver, , today who reports patient and family have been discussing touring facilities to consider for patient . Follow up regarding medication adherence o Reports now all medications, including OTC items, have been collected and caregiver manages and administers medications to patient o Reports has adjusted patient's medication regimen as directed by PCP o Verbalizes understanding via teach back provider's instructions for tapering patient off of escitalopram o Counsel on importance of separating administration of multivitamin from levothyroxine by at least 4 hours o Review importance of consistent administration of levothyroxine dose on empty stomach . Caregiver asks about possible food resource to help with providing meals to patient, particularly to aid with  lunchtime meal o Will send referral to care guide team requesting outreach to caregiver about community food resources  Patient Self Care Activities:  . Unable to self administer medications  as prescribed o Medications managed and administered by patient's daughter-in-law/caregiver  Please see past updates related to this goal by clicking on the "Past Updates" button in the selected goal         Plan  Telephone follow up appointment with care management team member scheduled for: 10/13 at 9:45 am  Duanne Moron, PharmD, [facility] Clinical Pharmacist [facility] [phone]

## 2019-09-18 ENCOUNTER — Encounter: Payer: Self-pay | Admitting: Family Medicine

## 2019-09-18 DIAGNOSIS — Z66 Do not resuscitate: Secondary | ICD-10-CM | POA: Insufficient documentation

## 2019-09-19 ENCOUNTER — Telehealth: Payer: Self-pay

## 2019-09-21 ENCOUNTER — Ambulatory Visit (INDEPENDENT_AMBULATORY_CARE_PROVIDER_SITE_OTHER): Payer: Medicare HMO | Admitting: Licensed Clinical Social Worker

## 2019-09-21 DIAGNOSIS — F0390 Unspecified dementia without behavioral disturbance: Secondary | ICD-10-CM

## 2019-09-21 DIAGNOSIS — F03A0 Unspecified dementia, mild, without behavioral disturbance, psychotic disturbance, mood disturbance, and anxiety: Secondary | ICD-10-CM

## 2019-09-21 DIAGNOSIS — F0151 Vascular dementia with behavioral disturbance: Secondary | ICD-10-CM

## 2019-09-21 DIAGNOSIS — I739 Peripheral vascular disease, unspecified: Secondary | ICD-10-CM

## 2019-09-21 DIAGNOSIS — I10 Essential (primary) hypertension: Secondary | ICD-10-CM

## 2019-09-21 DIAGNOSIS — F01518 Vascular dementia, unspecified severity, with other behavioral disturbance: Secondary | ICD-10-CM

## 2019-09-21 DIAGNOSIS — E039 Hypothyroidism, unspecified: Secondary | ICD-10-CM

## 2019-09-21 NOTE — Chronic Care Management (AMB) (Signed)
Chronic Care Management    Clinical Social Work Follow Up Note  09/21/2019 Name: Alylah Blakney MRN: [id number] DOB: 11-12-1924  Chrisha Vogel is a 86 y.o. year old female who is a primary care patient of [name], DO. The CCM team was consulted for assistance with Level of Care Concerns and Caregiver Stress.   Review of patient status, including review of consultants reports, other relevant assessments, and collaboration with appropriate care team members and the patient's provider was performed as part of comprehensive patient evaluation and provision of chronic care management services.    SDOH (Social Determinants of Health) assessments performed: Yes    Outpatient Encounter Medications as of 09/21/2019  Medication Sig  . donepezil (ARICEPT) 10 MG tablet Take 1 tablet (10 mg total) by mouth at bedtime.  Marland Kitchen levothyroxine (SYNTHROID) 75 MCG tablet Take 1 tablet (75 mcg total) by mouth daily.  . metoprolol succinate (TOPROL-XL) 25 MG 24 hr tablet Take 1 tablet (25 mg total) by mouth daily.  . Multiple Vitamin (MULTIVITAMIN WITH MINERALS) TABS tablet Take 1 tablet by mouth daily.  . traZODone (DESYREL) 50 MG tablet Take 1 tablet (50 mg total) by mouth at bedtime.   No facility-administered encounter medications on file as of 09/21/2019.     Goals Addressed    .  SW- "We want ALF placement for [place]" (pt-stated)        CARE PLAN ENTRY (see longitudinal plan of care for additional care plan information)  Current Barriers:  . Limited social support . Level of care concerns . Mental Health Concerns  . Social Isolation . Limited access to caregiver . Cognitive Deficits . Memory Deficits  Clinical Social Work Clinical Goal(s):   Marland Kitchen Over the next 120 days, patient/caregiver will work with SW to address concerns related to lack of support/resource connection and need for ALF placement. LCSW will assist patient/caregiver in gaining additional support/resource connection and  community resource education in order to maintain health and mental health appropriately  . Over the next 120 days, family will apply for Special Assistance Medicaid. . Over the next 120 days, patient will demonstrate improved health management independence as evidenced by implementing healthy self-care skills and positive support/resources into her daily routine to help cope with stressors and improve overall health and well-being  . Over the next 120 days, patient or caregiver will verbalize basic understanding of depression/stress process and self health management plan as evidenced by her participation in development of long term plan of care and institution of self health management strategies  Interventions: . Inter-disciplinary care team collaboration (see longitudinal plan of care) . Patient interviewed and appropriate assessments performed. Patient is in need of ALF placement. LCSW provided [name] (caregiver) with extensive education on how long term care placement, Medicaid enrollment and their financial requirements and steps to transfer Medicaid if ever needed. CCM RNCM has already hand delivered a list of ALF's within [facility]. Patient ask if LCSW can email and mail all resource information to patient. LCSW will send resources out today on 09/12/19 . Provided mental health counseling with regard to anxiety management. Patient continues to express great anxiety during 10 am-3 pm everyday. LCSW provided education on how patient can effectively manage those symptoms throughout the day better.  . Discussed plans with patient for ongoing care management follow up and provided patient with direct contact information for care management team . Advised patient/family to go to DSS to apply for Special Assistance Medicaid. Family wish to talk with  their attorney first as patient has a trust that she is unable to touch or use for LTC placement expenses.  . Family will start to complete tours at local  ALF's and will notify PCP office once they have found the facility of their choosing in order to start FL2.  [name] with primary care provider re: LTC placement and Medication concerns. . CCM team updated on family's decision to start ALF placement at this time.  . Assisted patient/caregiver with obtaining information about health plan benefits . Provided education and assistance to client regarding Advanced Directives. . Provided education to patient/caregiver regarding level of care options. . Provided education to patient/caregiver about Hospice and/or Palliative Care services . Caregiver was wondering if patient could take Trazodone in the morning or during the day as patient is experiencing the most anxiety from 10 am - 3 pm. Patient is lashing out the most on caregiver during this time as well. Per PCP, Regarding the medication change request. I would not change the current medication again, we just changed it yesterday. We are tapering off the Escitalopram, and switching to the Trazodone. The idea with the trazodone was to promote better sleep and improve the mood, it should provide some benefit throughout the day, not just only at bedtime only. I would advise against adding a PRN medicine for agitation/anxiety at this time - this can be complicated on when to use those medicines and most of them are not without risk. For right now, I would give the new rx Trazodone a chance, it will take several days to weeks to have full benefit. In the future, we may consider using a 2nd dose of Trazodone as a PRN in afternoon for agitation but I would not recommend starting that yet since we are just starting this medicine now. I think at some point a symptom management from a Palliative or other input may be beneficial, but medicating for anxiety is not always the easiest or preferred option. Family was updated and is agreeable ot this plan.  . Family has started looking into memory care placement at  [facility]F which is located in [place], [place]. CCM RNCM updated.  . DNR has been completed by PCP and picked up successfully by daughter.  . Patient remains not agreeable to LTC placement but family has POA and is able to place patient without her consent. Patient prefers to return to her residence in [place], [place]. Ongoing emotional support, care coordination and strategizing provided to caregiver in order to improve patient's health and safety.   Patient Self Care Activities:  . Attends all scheduled provider appointments . Lacks social connections  Please see past updates related to this goal by clicking on the "Past Updates" button in the selected goal       Follow Up Plan: SW will follow up with patient by phone over the next 30-45 days  Dickie La, [employer], MSW, LCSW [facility]  [facility] [email or web address] Phone: [phone]

## 2019-09-28 ENCOUNTER — Telehealth: Payer: Self-pay

## 2019-10-05 ENCOUNTER — Telehealth: Payer: Self-pay | Admitting: General Practice

## 2019-10-05 ENCOUNTER — Ambulatory Visit: Payer: Self-pay | Admitting: General Practice

## 2019-10-05 DIAGNOSIS — F0151 Vascular dementia with behavioral disturbance: Secondary | ICD-10-CM

## 2019-10-05 DIAGNOSIS — E039 Hypothyroidism, unspecified: Secondary | ICD-10-CM | POA: Diagnosis not present

## 2019-10-05 DIAGNOSIS — I6523 Occlusion and stenosis of bilateral carotid arteries: Secondary | ICD-10-CM

## 2019-10-05 DIAGNOSIS — F0390 Unspecified dementia without behavioral disturbance: Secondary | ICD-10-CM

## 2019-10-05 DIAGNOSIS — I739 Peripheral vascular disease, unspecified: Secondary | ICD-10-CM

## 2019-10-05 DIAGNOSIS — I10 Essential (primary) hypertension: Secondary | ICD-10-CM

## 2019-10-05 DIAGNOSIS — F01518 Vascular dementia, unspecified severity, with other behavioral disturbance: Secondary | ICD-10-CM
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
## 2019-10-05 NOTE — Telephone Encounter (Cosign Needed)
  Chronic Care Management   Note  10/05/2019 Name: Kathy Mata MRN: [id number] DOB: [birth year]  A brief call was placed back to Blanca Friend after instructions and recommendations given by Dr. Althea Charon.  Instructed [name] to give the patient Imodium prn and follow the instructions on the packaging for diarrhea. [name] states the diarrhea had been going on since before the patient started Aricept. Education on the medication causing some issues with diarrhea. Per [name] this has been stopped. Education and Empathetic listening.  Also offered a palliative care consult for the patient.  [name] wants to "sit on this" for another week or so to see if [name] gives information to her on placement. This is what her and her husband really want for the patient and feel it is the best course of action for the patient. Education on talking to Dr. Althea Charon about palliative consult for questions and concerns.   [name] cancelled the appointment for next week with Dr. Althea Charon. Instructed [name] that Dr. Althea Charon can do a video or telephone call visit to speak to [name] about the changes in the patient and recommendations. [name] verbalized understanding. Will continue to monitor for patient needs and family support.   Follow up plan: Telephone follow up appointment with care management team member scheduled for: 11-16-2019 at 11:30.  Alto Denver RN, MSN, CCM Community Care Coordinator [place]  [facility] [place] Mobile: [phone]

## 2019-10-09 ENCOUNTER — Telehealth: Payer: Self-pay | Admitting: Family Medicine

## 2019-10-09 ENCOUNTER — Ambulatory Visit: Payer: Self-pay | Admitting: General Practice

## 2019-10-09 DIAGNOSIS — F03A0 Unspecified dementia, mild, without behavioral disturbance, psychotic disturbance, mood disturbance, and anxiety: Secondary | ICD-10-CM

## 2019-10-09 DIAGNOSIS — F01518 Vascular dementia, unspecified severity, with other behavioral disturbance: Secondary | ICD-10-CM

## 2019-10-09 DIAGNOSIS — F0151 Vascular dementia with behavioral disturbance: Secondary | ICD-10-CM

## 2019-10-09 DIAGNOSIS — F0390 Unspecified dementia without behavioral disturbance: Secondary | ICD-10-CM
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
## 2019-10-09 NOTE — Telephone Encounter (Signed)
Notified by Alto Denver RN CCM care management that patient's daughter in law HCPOA Blanca Friend is now ready to proceed with Palliative referral for help with home visit and symptom management for patient's dementia worsening.  New referral to [facility] now.  Reason for Referral: New referral to Palliative Care for home evaluation symptom management and goals of care, she has progressive vascular dementia with behavior disturbance, and caregiver, daughter in law Blanca Friend, having difficulty managing her at home, will need assistance with symptom management recommendations to help with dementia / agitation / behavioral   Has the referral been discussed with the patient?: Yes / daughter in law HCPOA   Designated contact for the referral if not the patient (name/phone number): Blanca Friend (Daughter in law, [place]) [phone]   Has the patient seen a specialist for this issue before?: No  If so, who (practice/provider)?   Does the patient have a provider or location preference for the referral?: Yes - [facility] - in home evaluation by NP and ongoing follow-up for symptom management.   Would the patient like to see previous specialist if applicable?  Saralyn Pilar, DO [facility] 10/09/2019, 5:48 PM

## 2019-10-10 ENCOUNTER — Ambulatory Visit: Payer: Medicare HMO | Admitting: Family Medicine

## 2019-10-12 ENCOUNTER — Ambulatory Visit: Payer: Self-pay | Admitting: Licensed Clinical Social Worker

## 2019-10-12 DIAGNOSIS — I739 Peripheral vascular disease, unspecified: Secondary | ICD-10-CM

## 2019-10-12 DIAGNOSIS — F0390 Unspecified dementia without behavioral disturbance: Secondary | ICD-10-CM

## 2019-10-12 DIAGNOSIS — E039 Hypothyroidism, unspecified: Secondary | ICD-10-CM

## 2019-10-12 DIAGNOSIS — I10 Essential (primary) hypertension: Secondary | ICD-10-CM

## 2019-10-12 DIAGNOSIS — F01518 Vascular dementia, unspecified severity, with other behavioral disturbance: Secondary | ICD-10-CM

## 2019-10-12 DIAGNOSIS — F03A0 Unspecified dementia, mild, without behavioral disturbance, psychotic disturbance, mood disturbance, and anxiety: Secondary | ICD-10-CM

## 2019-10-12 DIAGNOSIS — F0151 Vascular dementia with behavioral disturbance: Secondary | ICD-10-CM

## 2019-10-12 NOTE — Chronic Care Management (AMB) (Signed)
Chronic Care Management    Clinical Social Work Follow Up Note  10/12/2019 Name: Kathy Mata MRN: [id number] DOB: Mar 15, 1924  Kathy Mata is a 86 y.o. year old female who is a primary care patient of [name], DO. The CCM team was consulted for assistance with Level of Care Concerns.   Review of patient status, including review of consultants reports, other relevant assessments, and collaboration with appropriate care team members and the patient's provider was performed as part of comprehensive patient evaluation and provision of chronic care management services.    SDOH (Social Determinants of Health) assessments performed: Yes    Outpatient Encounter Medications as of 10/12/2019  Medication Sig  . donepezil (ARICEPT) 10 MG tablet Take 1 tablet (10 mg total) by mouth at bedtime.  Marland Kitchen levothyroxine (SYNTHROID) 75 MCG tablet Take 1 tablet (75 mcg total) by mouth daily.  . metoprolol succinate (TOPROL-XL) 25 MG 24 hr tablet Take 1 tablet (25 mg total) by mouth daily.  . Multiple Vitamin (MULTIVITAMIN WITH MINERALS) TABS tablet Take 1 tablet by mouth daily.  . traZODone (DESYREL) 50 MG tablet Take 1 tablet (50 mg total) by mouth at bedtime.   No facility-administered encounter medications on file as of 10/12/2019.     Goals Addressed    .  SW- "We want ALF placement for [place]" (pt-stated)        CARE PLAN ENTRY (see longitudinal plan of care for additional care plan information)  Current Barriers:  . Limited social support . Level of care concerns . Mental Health Concerns  . Social Isolation . Limited access to caregiver . Cognitive Deficits . Memory Deficits  Clinical Social Work Clinical Goal(s):   Marland Kitchen Over the next 120 days, patient/caregiver will work with SW to address concerns related to lack of support/resource connection and need for ALF placement. LCSW will assist patient/caregiver in gaining additional support/resource connection and community resource  education in order to maintain health and mental health appropriately  . Over the next 120 days, family will apply for Special Assistance Medicaid. . Over the next 120 days, patient will demonstrate improved health management independence as evidenced by implementing healthy self-care skills and positive support/resources into her daily routine to help cope with stressors and improve overall health and well-being  . Over the next 120 days, patient or caregiver will verbalize basic understanding of depression/stress process and self health management plan as evidenced by her participation in development of long term plan of care and institution of self health management strategies  Interventions: . Inter-disciplinary care team collaboration (see longitudinal plan of care) . Patient interviewed and appropriate assessments performed. Patient is in need of ALF placement. LCSW provided [name] (caregiver) with extensive education on how long term care placement, Medicaid enrollment and their financial requirements and steps to transfer Medicaid if ever needed. CCM RNCM has already hand delivered a list of ALF's within [facility]. Patient ask if LCSW can email and mail all resource information to patient. LCSW will send resources out on 09/12/19. UPDATE- Patient's caregiver reports that [name] declined patient for placement. [name] states that she contacted [name] and was informed that she would need to spend down patient's assets (through ALF placement) before she could qualify for Special Assistance Medicaid. Family is unsure if they wish to do this and want to consider in home support AND ALF placement at this time.  . Provided mental health counseling with regard to anxiety management. Patient continues to express great anxiety during 10  am-3 pm everyday. LCSW provided education on how patient can effectively manage those symptoms throughout the day better.  . Discussed plans with patient for ongoing  care management follow up and provided patient with direct contact information for care management team . Advised patient/family to go to DSS to apply for Special Assistance Medicaid. Family wish to talk with their attorney first as patient has a trust that she is unable to touch or use for LTC placement expenses. Family has been unable to reach their attorney in order to discuss patient's trust. LCSW advised [name] to contact DSS for Special Assistance Medicaid information regarding patient's trust as this is our of our scope of practice.  . Family will start to complete tours at local ALF's and will notify PCP office once they have found the facility of their choosing in order to start FL2.  [name] with primary care provider and CCM RNCM re: LTC placement concerns regarding payment . Assisted patient/caregiver with obtaining information about health plan benefits . Provided education and assistance to client regarding Advanced Directives. . Provided education to patient/caregiver regarding level of care options. . Provided education to patient/caregiver about Hospice and/or Palliative Care services . Caregiver was wondering if patient could take Trazodone in the morning or during the day as patient is experiencing the most anxiety from 10 am - 3 pm. Patient is lashing out the most on caregiver during this time as well. Per PCP, Regarding the medication change request. I would not change the current medication again, we just changed it yesterday. We are tapering off the Escitalopram, and switching to the Trazodone. The idea with the trazodone was to promote better sleep and improve the mood, it should provide some benefit throughout the day, not just only at bedtime only. I would advise against adding a PRN medicine for agitation/anxiety at this time - this can be complicated on when to use those medicines and most of them are not without risk. For right now, I would give the new rx Trazodone a chance,  it will take several days to weeks to have full benefit. In the future, we may consider using a 2nd dose of Trazodone as a PRN in afternoon for agitation but I would not recommend starting that yet since we are just starting this medicine now. I think at some point a symptom management from a Palliative or other input may be beneficial, but medicating for anxiety is not always the easiest or preferred option. Family was updated and is agreeable ot this plan.  . Family has started looking into memory care/ALF placement but are struggling to decide if they wish to pay out of pocket (spend down assets in order to qualify for Medicaid) or keep patient in the home and hire in home support. C3 referral made per [name]'s request for in home support education. . DNR has been completed by PCP and picked up successfully by daughter.  . Patient remains not agreeable to LTC placement but family has POA and is able to place patient without her consent. Patient prefers to return to her residence in [place], [place]. Ongoing emotional support, care coordination and strategizing provided to caregiver in order to improve patient's health and safety.   Patient Self Care Activities:  . Attends all scheduled provider appointments . Lacks social connections  Please see past updates related to this goal by clicking on the "Past Updates" button in the selected goal      Follow Up Plan: [employer]  will reach out to patient for assistance with in home support resource education and connection.  Dickie La, BSW, MSW, LCSW [facility] [place]  [facility] [email or web address] Phone: [phone]

## 2019-10-13 ENCOUNTER — Emergency Department: Payer: Medicare HMO

## 2019-10-13 ENCOUNTER — Emergency Department: Payer: Medicare HMO | Admitting: Anesthesiology

## 2019-10-13 ENCOUNTER — Other Ambulatory Visit: Payer: Self-pay

## 2019-10-13 ENCOUNTER — Encounter
Admission: EM | Disposition: A | Discharge status: Skilled Nursing Facility | Payer: Self-pay | Source: Home / Self Care | Attending: Orthopedic Surgery

## 2019-10-13 ENCOUNTER — Inpatient Hospital Stay
Admission: EM | Admit: 2019-10-13 | Discharge: 2019-10-19 | DRG: 481 | Disposition: A | Discharge status: Skilled Nursing Facility | Payer: Medicare HMO | Attending: Orthopedic Surgery | Admitting: Orthopedic Surgery

## 2019-10-13 DIAGNOSIS — F0151 Vascular dementia with behavioral disturbance: Secondary | ICD-10-CM | POA: Diagnosis not present

## 2019-10-13 DIAGNOSIS — M6281 Muscle weakness (generalized): Secondary | ICD-10-CM | POA: Diagnosis not present

## 2019-10-13 DIAGNOSIS — R636 Underweight: Secondary | ICD-10-CM | POA: Diagnosis present

## 2019-10-13 DIAGNOSIS — Z043 Encounter for examination and observation following other accident: Secondary | ICD-10-CM | POA: Diagnosis not present

## 2019-10-13 DIAGNOSIS — F0390 Unspecified dementia without behavioral disturbance: Secondary | ICD-10-CM | POA: Diagnosis present

## 2019-10-13 DIAGNOSIS — Z20822 Contact with and (suspected) exposure to covid-19: Secondary | ICD-10-CM | POA: Diagnosis present

## 2019-10-13 DIAGNOSIS — Z419 Encounter for procedure for purposes other than remedying health state, unspecified: Secondary | ICD-10-CM

## 2019-10-13 DIAGNOSIS — S7222XA Displaced subtrochanteric fracture of left femur, initial encounter for closed fracture: Secondary | ICD-10-CM

## 2019-10-13 DIAGNOSIS — R279 Unspecified lack of coordination: Secondary | ICD-10-CM | POA: Diagnosis not present

## 2019-10-13 DIAGNOSIS — D62 Acute posthemorrhagic anemia: Secondary | ICD-10-CM | POA: Diagnosis not present

## 2019-10-13 DIAGNOSIS — R001 Bradycardia, unspecified: Secondary | ICD-10-CM | POA: Diagnosis present

## 2019-10-13 DIAGNOSIS — S7222XD Displaced subtrochanteric fracture of left femur, subsequent encounter for closed fracture with routine healing: Secondary | ICD-10-CM | POA: Diagnosis not present

## 2019-10-13 DIAGNOSIS — R5381 Other malaise: Secondary | ICD-10-CM | POA: Diagnosis not present

## 2019-10-13 DIAGNOSIS — S0081XA Abrasion of other part of head, initial encounter: Secondary | ICD-10-CM | POA: Diagnosis present

## 2019-10-13 DIAGNOSIS — W1811XA Fall from or off toilet without subsequent striking against object, initial encounter: Secondary | ICD-10-CM | POA: Diagnosis not present

## 2019-10-13 DIAGNOSIS — E890 Postprocedural hypothyroidism: Secondary | ICD-10-CM | POA: Diagnosis present

## 2019-10-13 DIAGNOSIS — Z79890 Hormone replacement therapy (postmenopausal): Secondary | ICD-10-CM

## 2019-10-13 DIAGNOSIS — Z79899 Other long term (current) drug therapy: Secondary | ICD-10-CM

## 2019-10-13 DIAGNOSIS — M25511 Pain in right shoulder: Secondary | ICD-10-CM | POA: Diagnosis not present

## 2019-10-13 DIAGNOSIS — I959 Hypotension, unspecified: Secondary | ICD-10-CM | POA: Diagnosis not present

## 2019-10-13 DIAGNOSIS — R0689 Other abnormalities of breathing: Secondary | ICD-10-CM | POA: Diagnosis not present

## 2019-10-13 DIAGNOSIS — S0990XA Unspecified injury of head, initial encounter: Secondary | ICD-10-CM | POA: Diagnosis not present

## 2019-10-13 DIAGNOSIS — I499 Cardiac arrhythmia, unspecified: Secondary | ICD-10-CM | POA: Diagnosis not present

## 2019-10-13 DIAGNOSIS — F418 Other specified anxiety disorders: Secondary | ICD-10-CM | POA: Diagnosis not present

## 2019-10-13 DIAGNOSIS — S72142B Displaced intertrochanteric fracture of left femur, initial encounter for open fracture type I or II: Secondary | ICD-10-CM | POA: Diagnosis not present

## 2019-10-13 DIAGNOSIS — I10 Essential (primary) hypertension: Secondary | ICD-10-CM | POA: Diagnosis present

## 2019-10-13 DIAGNOSIS — S7292XB Unspecified fracture of left femur, initial encounter for open fracture type I or II: Secondary | ICD-10-CM | POA: Diagnosis present

## 2019-10-13 DIAGNOSIS — W19XXXA Unspecified fall, initial encounter: Secondary | ICD-10-CM

## 2019-10-13 DIAGNOSIS — M79605 Pain in left leg: Secondary | ICD-10-CM | POA: Diagnosis not present

## 2019-10-13 DIAGNOSIS — Y92012 Bathroom of single-family (private) house as the place of occurrence of the external cause: Secondary | ICD-10-CM | POA: Diagnosis not present

## 2019-10-13 DIAGNOSIS — Z23 Encounter for immunization: Secondary | ICD-10-CM | POA: Diagnosis not present

## 2019-10-13 DIAGNOSIS — F99 Mental disorder, not otherwise specified: Secondary | ICD-10-CM | POA: Diagnosis not present

## 2019-10-13 DIAGNOSIS — Z681 Body mass index (BMI) 19 or less, adult: Secondary | ICD-10-CM

## 2019-10-13 DIAGNOSIS — S72142A Displaced intertrochanteric fracture of left femur, initial encounter for closed fracture: Secondary | ICD-10-CM | POA: Diagnosis not present

## 2019-10-13 DIAGNOSIS — R52 Pain, unspecified: Secondary | ICD-10-CM | POA: Diagnosis not present

## 2019-10-13 DIAGNOSIS — E039 Hypothyroidism, unspecified: Secondary | ICD-10-CM | POA: Diagnosis not present

## 2019-10-13 HISTORY — PX: INTRAMEDULLARY (IM) NAIL INTERTROCHANTERIC: SHX5875

## 2019-10-13 LAB — CBC WITH DIFFERENTIAL/PLATELET
Abs Immature Granulocytes: 0.14 10*3/uL — ABNORMAL HIGH (ref 0.00–0.07)
Basophils Absolute: 0 10*3/uL (ref 0.0–0.1)
Basophils Relative: 0 %
Eosinophils Absolute: 0 10*3/uL (ref 0.0–0.5)
Eosinophils Relative: 0 %
HCT: 28.8 % — ABNORMAL LOW (ref 36.0–46.0)
Hemoglobin: 9.4 g/dL — ABNORMAL LOW (ref 12.0–15.0)
Immature Granulocytes: 1 %
Lymphocytes Relative: 6 %
Lymphs Abs: 1.2 10*3/uL (ref 0.7–4.0)
MCH: 31.2 pg (ref 26.0–34.0)
MCHC: 32.6 g/dL (ref 30.0–36.0)
MCV: 95.7 fL (ref 80.0–100.0)
Monocytes Absolute: 2.6 10*3/uL — ABNORMAL HIGH (ref 0.1–1.0)
Monocytes Relative: 13 %
Neutro Abs: 15.4 10*3/uL — ABNORMAL HIGH (ref 1.7–7.7)
Neutrophils Relative %: 80 %
Platelets: 133 10*3/uL — ABNORMAL LOW (ref 150–400)
RBC: 3.01 MIL/uL — ABNORMAL LOW (ref 3.87–5.11)
RDW: 14.1 % (ref 11.5–15.5)
WBC: 19.4 10*3/uL — ABNORMAL HIGH (ref 4.0–10.5)
nRBC: 0 % (ref 0.0–0.2)

## 2019-10-13 LAB — COMPREHENSIVE METABOLIC PANEL
ALT: 18 U/L (ref 0–44)
AST: 23 U/L (ref 15–41)
Albumin: 3.1 g/dL — ABNORMAL LOW (ref 3.5–5.0)
Alkaline Phosphatase: 40 U/L (ref 38–126)
Anion gap: 7 (ref 5–15)
BUN: 20 mg/dL (ref 8–23)
CO2: 25 mmol/L (ref 22–32)
Calcium: 8.2 mg/dL — ABNORMAL LOW (ref 8.9–10.3)
Chloride: 106 mmol/L (ref 98–111)
Creatinine, Ser: 0.77 mg/dL (ref 0.44–1.00)
GFR calc Af Amer: >60 mL/min (ref >60)
GFR calc non Af Amer: >60 mL/min (ref >60)
Glucose, Bld: 134 mg/dL — ABNORMAL HIGH (ref 70–99)
Potassium: 3.6 mmol/L (ref 3.5–5.1)
Sodium: 138 mmol/L (ref 135–145)
Total Bilirubin: 1 mg/dL (ref 0.3–1.2)
Total Protein: 5.1 g/dL — ABNORMAL LOW (ref 6.5–8.1)

## 2019-10-13 LAB — RESPIRATORY PANEL BY RT PCR (FLU A&B, COVID)
Influenza A by PCR: NEGATIVE
Influenza B by PCR: NEGATIVE
SARS Coronavirus 2 by RT PCR: NEGATIVE

## 2019-10-13 LAB — TROPONIN I (HIGH SENSITIVITY): Troponin I (High Sensitivity): 14 ng/L (ref <18)

## 2019-10-13 SURGERY — FIXATION, FRACTURE, INTERTROCHANTERIC, WITH INTRAMEDULLARY ROD
Anesthesia: Spinal | Laterality: Left

## 2019-10-13 MED ORDER — MENTHOL 3 MG MT LOZG
1.0000 | LOZENGE | OROMUCOSAL | Status: DC | PRN
  Filled 2019-10-13: qty 9

## 2019-10-13 MED ORDER — METOCLOPRAMIDE HCL 10 MG PO TABS: 5.0000 mg | ORAL_TABLET | Freq: Three times a day (TID) | ORAL | Status: DC | PRN

## 2019-10-13 MED ORDER — FENTANYL CITRATE (PF) 100 MCG/2ML IJ SOLN
INTRAMUSCULAR | Status: AC
  Filled 2019-10-13: qty 2

## 2019-10-13 MED ORDER — LORAZEPAM 2 MG/ML IJ SOLN
INTRAMUSCULAR | Status: AC
  Administered 2019-10-13: 1 mg via INTRAVENOUS
  Filled 2019-10-13: qty 1

## 2019-10-13 MED ORDER — KETAMINE HCL 10 MG/ML IJ SOLN
1.0000 mg/kg | Freq: Once | INTRAMUSCULAR | Status: AC
  Administered 2019-10-13: 45 mg via INTRAVENOUS

## 2019-10-13 MED ORDER — FENTANYL CITRATE (PF) 100 MCG/2ML IJ SOLN
50.0000 ug | Freq: Once | INTRAMUSCULAR | Status: AC
  Administered 2019-10-13: 50 ug via INTRAVENOUS

## 2019-10-13 MED ORDER — MIDAZOLAM HCL 5 MG/5ML IJ SOLN
5.0000 mg | Freq: Once | INTRAMUSCULAR | Status: AC
  Administered 2019-10-13: 1 mg via INTRAVENOUS
  Filled 2019-10-13: qty 5

## 2019-10-13 MED ORDER — LEVOTHYROXINE SODIUM 75 MCG PO TABS
75.0000 ug | ORAL_TABLET | Freq: Every day | ORAL | Status: DC
  Administered 2019-10-14 – 2019-10-19 (×6): 75 ug via ORAL
  Filled 2019-10-13: qty 1
  Filled 2019-10-13 (×2): qty 3
  Filled 2019-10-13 (×2): qty 1
  Filled 2019-10-13 (×3): qty 3
  Filled 2019-10-13 (×2): qty 1
  Filled 2019-10-13: qty 3
  Filled 2019-10-13: qty 1

## 2019-10-13 MED ORDER — ZOLPIDEM TARTRATE 5 MG PO TABS
5.0000 mg | ORAL_TABLET | Freq: Every evening | ORAL | Status: DC | PRN
  Administered 2019-10-15 – 2019-10-18 (×3): 5 mg via ORAL
  Filled 2019-10-13 (×3): qty 1

## 2019-10-13 MED ORDER — PANTOPRAZOLE SODIUM 40 MG PO TBEC
40.0000 mg | DELAYED_RELEASE_TABLET | Freq: Every day | ORAL | Status: DC
  Administered 2019-10-14 – 2019-10-19 (×6): 40 mg via ORAL
  Filled 2019-10-13 (×6): qty 1

## 2019-10-13 MED ORDER — GLYCOPYRROLATE 0.2 MG/ML IJ SOLN
INTRAMUSCULAR | Status: DC | PRN
  Administered 2019-10-13: .2 mg via INTRAVENOUS

## 2019-10-13 MED ORDER — DONEPEZIL HCL 5 MG PO TABS
10.0000 mg | ORAL_TABLET | Freq: Every day | ORAL | Status: DC
  Administered 2019-10-13 – 2019-10-18 (×6): 10 mg via ORAL
  Filled 2019-10-13 (×6): qty 2

## 2019-10-13 MED ORDER — ONDANSETRON HCL 4 MG PO TABS: 4.0000 mg | ORAL_TABLET | Freq: Four times a day (QID) | ORAL | Status: DC | PRN

## 2019-10-13 MED ORDER — PROPOFOL 500 MG/50ML IV EMUL
INTRAVENOUS | Status: DC | PRN
  Administered 2019-10-13: 50 ug/kg/min via INTRAVENOUS

## 2019-10-13 MED ORDER — HYDROCODONE-ACETAMINOPHEN 5-325 MG PO TABS
1.0000 | ORAL_TABLET | ORAL | Status: DC | PRN
  Administered 2019-10-14 – 2019-10-15 (×4): 2 via ORAL
  Administered 2019-10-16: 1 via ORAL
  Administered 2019-10-16 – 2019-10-18 (×4): 2 via ORAL
  Administered 2019-10-18: 1 via ORAL
  Administered 2019-10-18: 2 via ORAL
  Administered 2019-10-19: 1 via ORAL
  Administered 2019-10-19: 2 via ORAL
  Filled 2019-10-13 (×3): qty 2
  Filled 2019-10-13: qty 1
  Filled 2019-10-13 (×3): qty 2
  Filled 2019-10-13: qty 1
  Filled 2019-10-13: qty 2
  Filled 2019-10-13: qty 1
  Filled 2019-10-13 (×3): qty 2

## 2019-10-13 MED ORDER — MAGNESIUM CITRATE PO SOLN
1.0000 | Freq: Once | ORAL | Status: DC | PRN
  Filled 2019-10-13 (×2): qty 296

## 2019-10-13 MED ORDER — ONDANSETRON HCL 4 MG/2ML IJ SOLN
INTRAMUSCULAR | Status: AC
  Filled 2019-10-13: qty 2

## 2019-10-13 MED ORDER — MORPHINE SULFATE (PF) 2 MG/ML IV SOLN: 0.5000 mg | INTRAVENOUS | Status: DC | PRN

## 2019-10-13 MED ORDER — ENOXAPARIN SODIUM 30 MG/0.3ML ~~LOC~~ SOLN
30.0000 mg | SUBCUTANEOUS | Status: DC
  Administered 2019-10-14 – 2019-10-19 (×6): 30 mg via SUBCUTANEOUS
  Filled 2019-10-13 (×6): qty 0.3

## 2019-10-13 MED ORDER — FENTANYL CITRATE (PF) 100 MCG/2ML IJ SOLN
INTRAMUSCULAR | Status: AC
  Administered 2019-10-13: 25 ug via INTRAVENOUS
  Filled 2019-10-13: qty 2

## 2019-10-13 MED ORDER — INFLUENZA VAC A&B SA ADJ QUAD 0.5 ML IM PRSY
0.5000 mL | PREFILLED_SYRINGE | INTRAMUSCULAR | Status: AC
  Administered 2019-10-19: 0.5 mL via INTRAMUSCULAR
  Filled 2019-10-13 (×2): qty 0.5

## 2019-10-13 MED ORDER — FENTANYL CITRATE (PF) 100 MCG/2ML IJ SOLN
INTRAMUSCULAR | Status: AC
  Administered 2019-10-13: 50 ug via INTRAVENOUS
  Filled 2019-10-13: qty 2

## 2019-10-13 MED ORDER — SODIUM CHLORIDE 0.9 % IV SOLN: INTRAVENOUS | Status: DC

## 2019-10-13 MED ORDER — METHOCARBAMOL 1000 MG/10ML IJ SOLN
500.0000 mg | Freq: Four times a day (QID) | INTRAVENOUS | Status: DC | PRN
  Filled 2019-10-13: qty 5

## 2019-10-13 MED ORDER — LACTATED RINGERS IV SOLN: INTRAVENOUS | Status: DC

## 2019-10-13 MED ORDER — ALUM & MAG HYDROXIDE-SIMETH 200-200-20 MG/5ML PO SUSP: 30.0000 mL | ORAL | Status: DC | PRN

## 2019-10-13 MED ORDER — ACETAMINOPHEN 10 MG/ML IV SOLN
INTRAVENOUS | Status: DC | PRN
  Administered 2019-10-13: 1000 mg via INTRAVENOUS

## 2019-10-13 MED ORDER — FENTANYL CITRATE (PF) 100 MCG/2ML IJ SOLN
25.0000 ug | INTRAMUSCULAR | Status: DC | PRN
  Administered 2019-10-13 (×3): 25 ug via INTRAVENOUS

## 2019-10-13 MED ORDER — LACTATED RINGERS IV BOLUS
1000.0000 mL | Freq: Once | INTRAVENOUS | Status: AC
  Administered 2019-10-13: 1000 mL via INTRAVENOUS

## 2019-10-13 MED ORDER — FENTANYL CITRATE (PF) 100 MCG/2ML IJ SOLN
25.0000 ug | Freq: Once | INTRAMUSCULAR | Status: AC
  Administered 2019-10-13: 25 ug via INTRAVENOUS

## 2019-10-13 MED ORDER — LORAZEPAM 2 MG/ML IJ SOLN
1.0000 mg | INTRAMUSCULAR | Status: DC | PRN
  Administered 2019-10-13 – 2019-10-18 (×5): 1 mg via INTRAVENOUS
  Filled 2019-10-13 (×5): qty 1

## 2019-10-13 MED ORDER — ONDANSETRON HCL 4 MG/2ML IJ SOLN
4.0000 mg | Freq: Four times a day (QID) | INTRAMUSCULAR | Status: DC | PRN
  Administered 2019-10-15: 4 mg via INTRAVENOUS
  Filled 2019-10-13: qty 2

## 2019-10-13 MED ORDER — NALOXONE HCL 2 MG/2ML IJ SOSY
0.4000 mg | PREFILLED_SYRINGE | Freq: Once | INTRAMUSCULAR | Status: DC
  Filled 2019-10-13: qty 2

## 2019-10-13 MED ORDER — MIDAZOLAM HCL 5 MG/5ML IJ SOLN
0.5000 mg | Freq: Once | INTRAMUSCULAR | Status: AC
  Administered 2019-10-13: 0.5 mg via INTRAVENOUS

## 2019-10-13 MED ORDER — FENTANYL CITRATE (PF) 100 MCG/2ML IJ SOLN
200.0000 ug | Freq: Once | INTRAMUSCULAR | Status: AC
  Administered 2019-10-13: 50 ug via INTRAVENOUS
  Filled 2019-10-13: qty 4

## 2019-10-13 MED ORDER — CEFAZOLIN SODIUM-DEXTROSE 1-4 GM/50ML-% IV SOLN
INTRAVENOUS | Status: AC
  Filled 2019-10-13: qty 50

## 2019-10-13 MED ORDER — FLUMAZENIL 0.5 MG/5ML IV SOLN
0.5000 mg | Freq: Once | INTRAVENOUS | Status: DC
  Filled 2019-10-13 (×2): qty 5

## 2019-10-13 MED ORDER — MIDAZOLAM HCL 2 MG/2ML IJ SOLN
INTRAMUSCULAR | Status: AC
  Administered 2019-10-13: 1 mg via INTRAVENOUS
  Filled 2019-10-13: qty 2

## 2019-10-13 MED ORDER — CEFAZOLIN SODIUM-DEXTROSE 1-4 GM/50ML-% IV SOLN
1.0000 g | Freq: Once | INTRAVENOUS | Status: AC
  Administered 2019-10-13: 1 g via INTRAVENOUS
  Filled 2019-10-13: qty 50

## 2019-10-13 MED ORDER — CHLORHEXIDINE GLUCONATE CLOTH 2 % EX PADS
6.0000 | MEDICATED_PAD | Freq: Every day | CUTANEOUS | Status: DC
  Administered 2019-10-13: 6 via TOPICAL

## 2019-10-13 MED ORDER — FENTANYL CITRATE (PF) 100 MCG/2ML IJ SOLN: 50.0000 ug | Freq: Once | INTRAMUSCULAR | Status: AC

## 2019-10-13 MED ORDER — PROPOFOL 10 MG/ML IV BOLUS
INTRAVENOUS | Status: DC | PRN
  Administered 2019-10-13: 10 mg via INTRAVENOUS
  Administered 2019-10-13 (×2): 20 mg via INTRAVENOUS
  Administered 2019-10-13: 10 mg via INTRAVENOUS

## 2019-10-13 MED ORDER — PHENOL 1.4 % MT LIQD
1.0000 | OROMUCOSAL | Status: DC | PRN
  Filled 2019-10-13: qty 177

## 2019-10-13 MED ORDER — MIDAZOLAM HCL 2 MG/2ML IJ SOLN
1.0000 mg | Freq: Once | INTRAMUSCULAR | Status: AC
  Administered 2019-10-13: 1 mg via INTRAVENOUS

## 2019-10-13 MED ORDER — DOCUSATE SODIUM 100 MG PO CAPS
100.0000 mg | ORAL_CAPSULE | Freq: Two times a day (BID) | ORAL | Status: DC
  Administered 2019-10-13 – 2019-10-19 (×12): 100 mg via ORAL
  Filled 2019-10-13 (×11): qty 1

## 2019-10-13 MED ORDER — METOCLOPRAMIDE HCL 5 MG/ML IJ SOLN: 5.0000 mg | Freq: Three times a day (TID) | INTRAMUSCULAR | Status: DC | PRN

## 2019-10-13 MED ORDER — HYDROCODONE-ACETAMINOPHEN 7.5-325 MG PO TABS
1.0000 | ORAL_TABLET | ORAL | Status: DC | PRN
  Administered 2019-10-14 – 2019-10-15 (×2): 2 via ORAL
  Filled 2019-10-13 (×2): qty 2

## 2019-10-13 MED ORDER — NAPROXEN 250 MG PO TABS
250.0000 mg | ORAL_TABLET | Freq: Once | ORAL | Status: AC
  Administered 2019-10-13: 250 mg via ORAL
  Filled 2019-10-13: qty 1

## 2019-10-13 MED ORDER — MIDAZOLAM HCL 2 MG/2ML IJ SOLN
0.5000 mg | Freq: Once | INTRAMUSCULAR | Status: AC
  Administered 2019-10-13: 0.5 mg via INTRAVENOUS

## 2019-10-13 MED ORDER — ACETAMINOPHEN 325 MG PO TABS
325.0000 mg | ORAL_TABLET | Freq: Four times a day (QID) | ORAL | Status: DC | PRN
  Administered 2019-10-15 – 2019-10-16 (×2): 650 mg via ORAL
  Filled 2019-10-13 (×2): qty 2

## 2019-10-13 MED ORDER — MAGNESIUM HYDROXIDE 400 MG/5ML PO SUSP: 30.0000 mL | Freq: Every day | ORAL | Status: DC | PRN

## 2019-10-13 MED ORDER — ACETAMINOPHEN 10 MG/ML IV SOLN
INTRAVENOUS | Status: AC
  Filled 2019-10-13: qty 100

## 2019-10-13 MED ORDER — TRAZODONE HCL 50 MG PO TABS
50.0000 mg | ORAL_TABLET | Freq: Every day | ORAL | Status: DC
  Administered 2019-10-13 – 2019-10-18 (×6): 50 mg via ORAL
  Filled 2019-10-13 (×6): qty 1

## 2019-10-13 MED ORDER — CEFAZOLIN SODIUM-DEXTROSE 1-4 GM/50ML-% IV SOLN
1.0000 g | Freq: Three times a day (TID) | INTRAVENOUS | Status: AC
  Administered 2019-10-13 – 2019-10-15 (×6): 1 g via INTRAVENOUS
  Filled 2019-10-13 (×10): qty 50

## 2019-10-13 MED ORDER — KETAMINE HCL 10 MG/ML IJ SOLN
INTRAMUSCULAR | Status: AC
  Filled 2019-10-13: qty 1

## 2019-10-13 MED ORDER — CEFAZOLIN (ANCEF) 1 G IV SOLR: 1.0000 g | INTRAVENOUS | Status: DC

## 2019-10-13 MED ORDER — FENTANYL CITRATE (PF) 100 MCG/2ML IJ SOLN
INTRAMUSCULAR | Status: DC | PRN
Start: 2019-10-13 — End: 2019-10-13
  Administered 2019-10-13 (×2): 25 ug via INTRAVENOUS
  Administered 2019-10-13: 50 ug via INTRAVENOUS

## 2019-10-13 MED ORDER — KETAMINE HCL 10 MG/ML IJ SOLN
1.0000 mg/kg | Freq: Once | INTRAMUSCULAR | Status: AC
  Administered 2019-10-13: 40 mg via INTRAVENOUS

## 2019-10-13 MED ORDER — BISACODYL 10 MG RE SUPP: 10.0000 mg | Freq: Every day | RECTAL | Status: DC | PRN

## 2019-10-13 MED ORDER — METHOCARBAMOL 500 MG PO TABS
500.0000 mg | ORAL_TABLET | Freq: Four times a day (QID) | ORAL | Status: DC | PRN
  Administered 2019-10-15 – 2019-10-18 (×4): 500 mg via ORAL
  Filled 2019-10-13 (×4): qty 1

## 2019-10-13 MED ORDER — MIDAZOLAM HCL 2 MG/2ML IJ SOLN: 1.0000 mg | Freq: Once | INTRAMUSCULAR | Status: AC

## 2019-10-13 MED ORDER — PHENYLEPHRINE HCL (PRESSORS) 10 MG/ML IV SOLN
INTRAVENOUS | Status: DC | PRN
  Administered 2019-10-13 (×4): 100 ug via INTRAVENOUS

## 2019-10-13 MED ORDER — PROPOFOL 500 MG/50ML IV EMUL
INTRAVENOUS | Status: AC
  Filled 2019-10-13: qty 50

## 2019-10-13 MED ORDER — ONDANSETRON HCL 4 MG/2ML IJ SOLN
4.0000 mg | Freq: Once | INTRAMUSCULAR | Status: AC | PRN
  Administered 2019-10-13: 4 mg via INTRAVENOUS

## 2019-10-13 MED ORDER — METOPROLOL SUCCINATE ER 25 MG PO TB24
25.0000 mg | ORAL_TABLET | Freq: Every day | ORAL | Status: DC
  Administered 2019-10-13 – 2019-10-19 (×7): 25 mg via ORAL
  Filled 2019-10-13 (×8): qty 1

## 2019-10-13 SURGICAL SUPPLY — 35 items
BIT DRILL 4.3MMS DISTAL GRDTED (BIT) ×1 IMPLANT
CABLE CERLAGE W/CRIMP 1.8 (Cable) ×2 IMPLANT
CABLE CERLAGE W/CRIMP 1.8MM (Cable) ×1 IMPLANT
CANISTER SUCT 1200ML W/VALVE (MISCELLANEOUS) ×3 IMPLANT
CHLORAPREP W/TINT 26 (MISCELLANEOUS) ×3 IMPLANT
COVER WAND RF STERILE (DRAPES) ×3 IMPLANT
DRAPE 3/4 80X56 (DRAPES) ×3 IMPLANT
DRAPE U-SHAPE 47X51 STRL (DRAPES) ×3 IMPLANT
DRILL 4.3MMS DISTAL GRADUATED (BIT) ×3
DRSG OPSITE POSTOP 3X4 (GAUZE/BANDAGES/DRESSINGS) ×9 IMPLANT
GLOVE BIOGEL PI IND STRL 9 (GLOVE) ×1 IMPLANT
GLOVE BIOGEL PI INDICATOR 9 (GLOVE) ×2
GLOVE SURG SYN 9.0  PF PI (GLOVE) ×2
GLOVE SURG SYN 9.0 PF PI (GLOVE) ×1 IMPLANT
GOWN SRG 2XL LVL 4 RGLN SLV (GOWNS) ×1 IMPLANT
GOWN STRL NON-REIN 2XL LVL4 (GOWNS) ×2
GOWN STRL REUS W/ TWL LRG LVL3 (GOWN DISPOSABLE) ×1 IMPLANT
GOWN STRL REUS W/TWL LRG LVL3 (GOWN DISPOSABLE) ×2
GUIDEWIRE BALL NOSE 100CM (WIRE) ×3 IMPLANT
HFN LH 130 DEG 9MM X 360MM (Nail) ×3 IMPLANT
HIP FRAC NAIL LAG SCR 10.5X100 (Orthopedic Implant) ×2 IMPLANT
KIT TURNOVER KIT A (KITS) ×3 IMPLANT
MAT ABSORB  FLUID 56X50 GRAY (MISCELLANEOUS) ×2
MAT ABSORB FLUID 56X50 GRAY (MISCELLANEOUS) ×1 IMPLANT
NEEDLE FILTER BLUNT 18X 1/2SAF (NEEDLE) ×2
NEEDLE FILTER BLUNT 18X1 1/2 (NEEDLE) ×1 IMPLANT
NS IRRIG 500ML POUR BTL (IV SOLUTION) ×3 IMPLANT
PACK HIP COMPR (MISCELLANEOUS) ×3 IMPLANT
SCALPEL PROTECTED #15 DISP (BLADE) ×6 IMPLANT
SCREW BONE CORTICAL 5.0X44 (Screw) ×3 IMPLANT
SCREW CANN THRD AFF 10.5X100 (Orthopedic Implant) ×1 IMPLANT
STAPLER SKIN PROX 35W (STAPLE) ×3 IMPLANT
SUT VIC AB 1 CT1 36 (SUTURE) ×3 IMPLANT
SUT VIC AB 2-0 CT1 (SUTURE) ×3 IMPLANT
SYR 10ML LL (SYRINGE) ×3 IMPLANT

## 2019-10-13 NOTE — Transfer of Care (Signed)
Immediate Anesthesia Transfer of Care Note  Patient: Kathy Mata  Procedure(s) Performed: INTRAMEDULLARY (IM) NAIL INTERTROCHANTRIC (Left )  Patient Location: PACU  Anesthesia Type:Spinal  Level of Consciousness: awake and alert   Airway & Oxygen Therapy: Patient Spontanous Breathing and Patient connected to face mask oxygen  Post-op Assessment: Report given to RN and Post -op Vital signs reviewed and stable  Post vital signs: Reviewed and stable  Last Vitals:  Vitals Value Taken Time  BP 121/52 10/13/19 1454  Temp    Pulse 79 10/13/19 1500  Resp 17 10/13/19 1500  SpO2 100 % 10/13/19 1500  Vitals shown include unvalidated device data.  Last Pain:  Vitals:   10/13/19 1454  TempSrc:   PainSc: (P) Asleep         Complications: No complications documented.

## 2019-10-13 NOTE — ED Notes (Signed)
To x-ray

## 2019-10-13 NOTE — ED Provider Notes (Addendum)
[facility] Emergency Department Provider Note   ____________________________________________   First MD Initiated Contact with Patient 10/13/19 [phone]     (approximate)  I have reviewed the triage vital signs and the nursing notes.   HISTORY  Chief Complaint Fall   HPI Rae Plotner is a 86 y.o. female who lives by herself who fell about 530 this morning.  Unknown loss consciousness.  Patient has an obvious left hip fracture.  There is some tenting of the skin in 1 area.         Past Medical History:  Diagnosis Date  . Hypertension   . Memory deficit     Patient Active Problem List   Diagnosis Date Noted  . DNR (do not resuscitate) 09/18/2019  . Depression with anxiety 09/11/2019  . Aortic atherosclerosis (HCC) 07/16/2019  . PAD (peripheral artery disease) (HCC) 07/06/2019  . Bilateral carotid artery stenosis 07/06/2019  . Acquired hypothyroidism 06/22/2019  . Essential hypertension 06/22/2019  . Vascular dementia with behavior disturbance (HCC) 06/22/2019    Past Surgical History:  Procedure Laterality Date  . CATARACT EXTRACTION, BILATERAL    . THYROIDECTOMY    . TONSILLECTOMY      Prior to Admission medications   Medication Sig Start Date End Date Taking? Authorizing Provider  donepezil (ARICEPT) 10 MG tablet Take 1 tablet (10 mg total) by mouth at bedtime. 09/11/19   Karamalegos, Netta Neat, DO  levothyroxine (SYNTHROID) 75 MCG tablet Take 1 tablet (75 mcg total) by mouth daily. 06/28/19   Karamalegos, Netta Neat, DO  metoprolol succinate (TOPROL-XL) 25 MG 24 hr tablet Take 1 tablet (25 mg total) by mouth daily. 06/28/19   Karamalegos, Netta Neat, DO  Multiple Vitamin (MULTIVITAMIN WITH MINERALS) TABS tablet Take 1 tablet by mouth daily.    [provider]  traZODone (DESYREL) 50 MG tablet Take 1 tablet (50 mg total) by mouth at bedtime. 09/11/19   [name], DO    Allergies Patient has no known  allergies.  No family history on file.  Social History Social History   Tobacco Use  . Smoking status: Never Smoker  . Smokeless tobacco: Never Used  Substance Use Topics  . Alcohol use: Never  . Drug use: Never    Review of Systems  Constitutional: No fever/chills Eyes: No visual changes. ENT: No sore throat. Cardiovascular: Denies chest pain. Respiratory: Denies shortness of breath. Gastrointestinal: No abdominal pain.  No nausea, no vomiting.  No diarrhea.  No constipation. Genitourinary: Negative for dysuria. Musculoskeletal: Negative for back pain. Skin: Negative for rash. Neurological: Negative for headaches, focal weakness  ____________________________________________   PHYSICAL EXAM:  VITAL SIGNS: ED Triage Vitals  Enc Vitals Group     BP 10/13/19 0704 (!) 148/49     Pulse Rate 10/13/19 0704 (!) 57     Resp 10/13/19 0704 16     Temp --      Temp src --      SpO2 10/13/19 0704 97 %     Weight 10/13/19 0701 110 lb (49.9 kg)     Height 10/13/19 0701 5\' 5"  (1.651 m)     Head Circumference --      Peak Flow --      Pain Score 10/13/19 0701 0     Pain Loc --      Pain Edu? --      Excl. in GC? --    Constitutional: Alert and oriented  Eyes: Conjunctivae are normal. PERRL. EOMI. Head:  Nose: No congestion/rhinnorhea. Mouth/Throat: Mucous membranes are moist.  Oropharynx non-erythematous. Neck: No stridor.  Cardiovascular: Normal rate, regular rhythm. Grossly normal heart sounds.  Good peripheral circulation. Respiratory: Normal respiratory effort.  No retractions. Lungs CTAB. Gastrointestinal: Soft and nontender. No distention. No abdominal bruits.  Musculoskeletal: Obvious fracture of the proximal femur on the left with skin tenting Neurologic:  Normal speech and language. No gross focal neurologic deficits are appreciated. Skin:  Skin is warm, dry and intact. No rash noted.  There is a bruise on the left femur and the abrasion on the  forehead   ____________________________________________   LABS (all labs ordered are listed, but only abnormal results are displayed)  Labs Reviewed  COMPREHENSIVE METABOLIC PANEL  CBC WITH DIFFERENTIAL/PLATELET  TROPONIN I (HIGH SENSITIVITY)  TROPONIN I (HIGH SENSITIVITY)   ____________________________________________  EKG  EKG read interpreted by me shows sinus bradycardia at 59 left axis no acute ST-T wave changes somewhat decreased R wave progression.  There is reading ST elevation consider inferior injury I do not see this. ____________________________________________  RADIOLOGY  ED MD interpretation: CT of the head reviewed in radiology by me did not see any acute problems C-spine briefly reviewed by me do not see any problems there either chest x-ray no obvious pneumonia or infiltrate by me and x-ray femur fracture with flexion of the proximal segment seen in x-ray by me we are awaiting the radiology results  Official radiology report(s): No results found.  ____________________________________________   PROCEDURES  Procedure(s) performed (including Critical Care): Critical care time 45 minutes.  This includes obtaining content sent for conscious sedation from the daughter-in-law who is with the patient and then sedating the patient reducing the fracture using hare traction splint to keep the fracture straight with the proximalmost strap over the proximal segment of the femur to hold it down as best as possible.  Patient had to be given more medicine in x-ray and the hair traction splint repositioned and x-ray as the pulse had been lost after moving the patient onto the x-ray table.  CT was done pulse maintained during that time and and maintained when patient was back in the ER room.  Dr. Rosita Kea contacted he came in to see the patient in the x-rays in radiology  Procedures   ____________________________________________   INITIAL IMPRESSION / ASSESSMENT AND PLAN / ED  COURSE   Patient is in no code but will do other medical treatments to her.             ____________________________________________   FINAL CLINICAL IMPRESSION(S) / ED DIAGNOSES  Final diagnoses:  Fall, initial encounter  Closed left subtrochanteric femur fracture, initial encounter [facility])     ED Discharge Orders    None      *Please note:  Ceceilia Cephus was evaluated in Emergency Department on 10/13/2019 for the symptoms described in the history of present illness. She was evaluated in the context of the global COVID-19 pandemic, which necessitated consideration that the patient might be at risk for infection with the SARS-CoV-2 virus that causes COVID-19. Institutional protocols and algorithms that pertain to the evaluation of patients at risk for COVID-19 are in a state of rapid change based on information released by regulatory bodies including the CDC and federal and state organizations. These policies and algorithms were followed during the patient's care in the ED.  Some ED evaluations and interventions may be delayed as a result of limited staffing during and the pandemic.*   Note:  This document  was prepared using Conservation officer, historic buildings and may include unintentional dictation errors.    Arnaldo Natal, MD 10/13/19 7591    Arnaldo Natal, MD 10/13/19 1420

## 2019-10-13 NOTE — Op Note (Signed)
10/13/2019  2:48 PM  PATIENT:  Kathy Mata  86 y.o. female  PRE-OPERATIVE DIAGNOSIS:  Left Femur Fracture, grade 1 open  POST-OPERATIVE DIAGNOSIS:   Same  PROCEDURE:  Procedure(s): INTRAMEDULLARY (IM) NAIL INTERTROCHANTRIC (Left) with I&D of open fracture  SURGEON: Leitha Schuller, MD  ASSISTANTS: None  ANESTHESIA:   spinal  EBL:  Total I/O In: -  Out: 230 [Urine:200; Blood:30]  BLOOD ADMINISTERED:none  DRAINS: Incisional wound VAC over open fracture site anterior thigh   LOCAL MEDICATIONS USED:  NONE  SPECIMEN:  No Specimen  DISPOSITION OF SPECIMEN:  N/A  COUNTS:  YES  TOURNIQUET:  * No tourniquets in log *  IMPLANTS: Biomet affixes 9 x 360 rod with 100 mm leg screw and 44 mm distal interlocking screw  DICTATION: .Dragon Dictation patient was brought the operating room and the initial hair splint and padding over the anterior thigh was removed and this showed a pinhole opening consistent with this being a grade 1 open fracture.  After spinal anesthesia was obtained the patient was placed on the fracture table and preop antibiotic given.  C arm was brought in and good visualization of the fracture was obtained.  With the leg slightly flexed and abducted the barrier drape method was applied after prepping and draping and appropriate patient identification timeout procedures were completed.  Incision was made at the level of the fracture laterally the vastus lateralis was elevated after splitting the IT band and the fracture could be reduced.  A single braided cable was placed around the fracture and was tightened but not crimped to allow for any additional manipulation at when the rod was passed.  AP lateral projection showed nearly anatomic alignment.  Next a small incision was made proximally and a guidewire inserted to the tip of the trochanter and proximal reaming carried out followed by passing the long guidewire.  Measurement made off of this and reaming to 11 mm gave [place] a  9 x 360 rod inserted down the canal.  Was the appropriate depth a lateral incision was made and 100 mm leg screw inserted with a quarter turn off to allow for future compression.  At this point the braided cable was tightened and crimped.  Permanent C-arm views obtained.  The leg was then abducted and lateral view of the distal femur obtained for placement of an interlocking screw.  A small incision was made over the oblique hole drilling carried out measuring and then a 44 mm screw plate placed across the oblique hole.  Anterior view showed appropriate position of the screw as well.  The wounds were then irrigated and then attention was turned to the anterior pinhole from the fracture penetrating there anteriorly.  This was opened approximately 3 cm and the subcutaneous tissue irrigated there was a small hole in the quadriceps and this was also irrigated out but no muscle debrided as it appeared intact.  The wounds were then closed with a heavy Vicryl for the deep fascia at the proximal incision and for the IT band 2-0 Vicryl subcutaneously and skin staples the open wound anteriorly was partially closed with staples and a Prevena wound VAC applied with Xeroform 4 x 4's ABDs applied to the lateral wounds.  PLAN OF CARE: Admit to inpatient   PATIENT DISPOSITION:  PACU - hemodynamically stable.

## 2019-10-13 NOTE — Consult Note (Signed)
Reason for Consult: Left proximal femur fracture Referring Physician: Dr. Darnelle Bos Kathy Mata is an 86 y.o. female.  HPI: Patient is a demented but otherwise healthy 86 year old who suffered a fall this morning.  She had deformity to the proximal femur with the bone close to the skin and I was consulted for further evaluation.  She seen in x-ray department where x-rays show a subtrochanteric fracture with extreme flexion of the proximal fragment.  No evidence of this being a pathologic fracture.  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  No prodromal symptoms that we are aware of.  Past Medical History:  Diagnosis Date  . Hypertension   . Memory deficit     Past Surgical History:  Procedure Laterality Date  . CATARACT EXTRACTION, BILATERAL    . THYROIDECTOMY    . TONSILLECTOMY      No family history on file.  Social History:  reports that she has never smoked. She has never used smokeless tobacco. She reports that she does not drink alcohol and does not use drugs.  Allergies: No Known Allergies  Medications: I have reviewed the patient's current medications.  No results found for this or any previous visit (from the past 48 hour(s)).  No results found.  Review of Systems Blood pressure (!) 148/49, pulse (!) 57, resp. rate 16, height 5\' 5"  (1.651 m), weight 49.9 kg, SpO2 97 %. Physical Exam There is obvious deformity of the proximal thigh with the leg in a hare splint the bone is palpable just under the skin.  She is able to flex extend the toes and has minimal pulses at the foot. Radiographs show proximal femur fracture subtrochanteric with significant displacement Assessment/Plan: Left subtrochanteric femur fracture Plan is for urgent ORIF to prevent this becoming an open fracture.  10/13/2019, 8:20 AM

## 2019-10-13 NOTE — ED Notes (Signed)
Procedure started to reduce left femur fracture

## 2019-10-13 NOTE — ED Notes (Signed)
To CT

## 2019-10-13 NOTE — Anesthesia Preprocedure Evaluation (Addendum)
Anesthesia Evaluation  Patient identified by MRN, date of birth, ID band Patient awake    Reviewed: Allergy & Precautions, NPO status , Patient's Chart, lab work & pertinent test results  History of Anesthesia Complications Negative for: history of anesthetic complications  Airway Mallampati: III       Dental   Pulmonary neg sleep apnea, neg COPD, Not current smoker,           Cardiovascular hypertension, Pt. on medications (-) Past MI and (-) CHF (-) dysrhythmias (-) Valvular Problems/Murmurs     Neuro/Psych neg Seizures Anxiety Depression Dementia    GI/Hepatic Neg liver ROS, neg GERD  ,  Endo/Other  neg diabetesHypothyroidism   Renal/GU negative Renal ROS     Musculoskeletal   Abdominal   Peds  Hematology   Anesthesia Other Findings   Reproductive/Obstetrics                            Anesthesia Physical Anesthesia Plan  ASA: III  Anesthesia Plan: Spinal   Post-op Pain Management:    Induction:   PONV Risk Score and Plan:   Airway Management Planned: Nasal Cannula  Additional Equipment:   Intra-op Plan:   Post-operative Plan:   Informed Consent: I have reviewed the patients History and Physical, chart, labs and discussed the procedure including the risks, benefits and alternatives for the proposed anesthesia with the patient or authorized representative who has indicated his/her understanding and acceptance.       Plan Discussed with:   Anesthesia Plan Comments:         Anesthesia Quick Evaluation

## 2019-10-13 NOTE — ED Notes (Signed)
Good pedal pulses in left foot

## 2019-10-13 NOTE — Anesthesia Procedure Notes (Signed)
Spinal  Patient location during procedure: OR Staffing Performed: resident/CRNA  Anesthesiologist: Gunnar Fusi, MD Resident/CRNA: Demetrius Charity, CRNA Preanesthetic Checklist Completed: patient identified, IV checked, site marked, risks and benefits discussed, surgical consent, monitors and equipment checked, pre-op evaluation and timeout performed Spinal Block Patient position: right lateral decubitus Prep: Betadine Patient monitoring: heart rate, continuous pulse ox, blood pressure and cardiac monitor Approach: midline Location: L3-4 Injection technique: single-shot Needle Needle type: Whitacre and Introducer  Needle gauge: 24 G Needle length: 9 cm Additional Notes Negative paresthesia. Negative blood return. Positive free-flowing CSF. Expiration date of kit checked and confirmed. Patient tolerated procedure well, without complications.

## 2019-10-13 NOTE — ED Notes (Signed)
Informed consent for procedural conscious sedation was obtained prior to procedure and pt daughter in law signed - this was obtained on paper d/t computer signature not working

## 2019-10-13 NOTE — ED Notes (Signed)
Report given to Mitch RN 

## 2019-10-13 NOTE — ED Notes (Signed)
Hair traction applied by Dr Darnelle Catalan

## 2019-10-13 NOTE — ED Triage Notes (Addendum)
Pt to [facility] from home, pt fell around 0530, unknown loc. Pt has obv deformity to left hip. Pt has hx of dementia. Pt has head lac

## 2019-10-13 NOTE — ED Notes (Signed)
Dr Darnelle Catalan consulted with Dr Rosita Kea

## 2019-10-13 NOTE — ED Notes (Signed)
Femur reduction procedure complete

## 2019-10-14 LAB — BASIC METABOLIC PANEL
Anion gap: 4 — ABNORMAL LOW (ref 5–15)
BUN: 15 mg/dL (ref 8–23)
CO2: 25 mmol/L (ref 22–32)
Calcium: 7.5 mg/dL — ABNORMAL LOW (ref 8.9–10.3)
Chloride: 104 mmol/L (ref 98–111)
Creatinine, Ser: 0.7 mg/dL (ref 0.44–1.00)
GFR calc Af Amer: >60 mL/min (ref >60)
GFR calc non Af Amer: >60 mL/min (ref >60)
Glucose, Bld: 110 mg/dL — ABNORMAL HIGH (ref 70–99)
Potassium: 3.8 mmol/L (ref 3.5–5.1)
Sodium: 133 mmol/L — ABNORMAL LOW (ref 135–145)

## 2019-10-14 LAB — CBC
HCT: 19 % — ABNORMAL LOW (ref 36.0–46.0)
Hemoglobin: 6.2 g/dL — ABNORMAL LOW (ref 12.0–15.0)
MCH: 31.2 pg (ref 26.0–34.0)
MCHC: 32.6 g/dL (ref 30.0–36.0)
MCV: 95.5 fL (ref 80.0–100.0)
Platelets: 126 10*3/uL — ABNORMAL LOW (ref 150–400)
RBC: 1.99 MIL/uL — ABNORMAL LOW (ref 3.87–5.11)
RDW: 14 % (ref 11.5–15.5)
WBC: 10.8 10*3/uL — ABNORMAL HIGH (ref 4.0–10.5)
nRBC: 0 % (ref 0.0–0.2)

## 2019-10-14 LAB — ABO/RH: ABO/RH(D): A POS

## 2019-10-14 LAB — PREPARE RBC (CROSSMATCH)

## 2019-10-14 MED ORDER — ENSURE ENLIVE PO LIQD
237.0000 mL | Freq: Two times a day (BID) | ORAL | Status: DC
  Administered 2019-10-15 – 2019-10-19 (×7): 237 mL via ORAL

## 2019-10-14 MED ORDER — SODIUM CHLORIDE 0.9% IV SOLUTION: Freq: Once | INTRAVENOUS | Status: AC

## 2019-10-14 NOTE — Progress Notes (Signed)
Physical Therapy Treatment Patient Details Name: Kathy Mata MRN: [id number] DOB: Jan 03, 1925 Today's Date: 10/14/2019    History of Present Illness Pateint is s/p INTRAMEDULLARY (IM) NAIL INTERTROCHANTRIC (Left) with I&D of open fracture by Dr Rosita Kea    PT Comments    Patient agrees to PT treatment. Pt reports no pain. Pt ambulated without AD prior to this hospital admission. Pt has -3/5 strength LLE hip and 3/5 knee and is min assist for bed mobility, min assist for transfers sit to stand with RW. Pt ambulates with RW 3  Feet fwd and bwd x 4 reps with min assist and reports of pain. Pt has poor  static standing  balance with posterior lean. Pt will continue to benefit from skilled PT to improve mobility and strength.  Follow Up Recommendations  SNF     Equipment Recommendations  Rolling walker with 5" wheels    Recommendations for Other Services       Precautions / Restrictions Precautions Precautions: Fall Restrictions Weight Bearing Restrictions: Yes LLE Weight Bearing: Weight bearing as tolerated Other Position/Activity Restrictions: WBAT LLE    Mobility  Bed Mobility Overal bed mobility: Needs Assistance Bed Mobility: Supine to Sit;Sit to Supine     Supine to sit: Min guard Sit to supine: Min guard   General bed mobility comments: min A for hand placement and L LE  Transfers Overall transfer level: Needs assistance Equipment used: Rolling walker (2 wheeled)             General transfer comment: Min to Mod A for transfers and sit<> stand - hand placement  Ambulation/Gait Ambulation/Gait assistance: Min assist Gait Distance (Feet): 15 Feet Assistive device: Rolling walker (2 wheeled) Gait Pattern/deviations: Step-to pattern     General Gait Details:  (posterior balance loss)   Stairs             Wheelchair Mobility    Modified Rankin (Stroke Patients Only)       Balance Overall balance assessment: Needs assistance Sitting-balance  support: No upper extremity supported Sitting balance-Leahy Scale: Good Sitting balance - Comments: UB dressing and R LE dressing no LOB   Standing balance support: Bilateral upper extremity supported Standing balance-Leahy Scale: Fair                              Cognition Arousal/Alertness: Awake/alert                                     General Comments: Per D-I-L pt dementia and - stay alone but they checc on her 3-4 x day - bring food - pt did not remember she broked her leg -she says she still line dance , and do everything her self at home -per pt  cooking      Exercises General Exercises - Lower Extremity Ankle Circles/Pumps:  (LAQ, Hip flex seated x 10 BLE) Other Exercises Other Exercises: LB dresing pt able to donn sock min A - doff Independent - side stepping to scoot up in bed S to CG -    General Comments        Pertinent Vitals/Pain Pain Assessment: Faces Faces Pain Scale: Hurts little more    Home Living Family/patient expects to be discharged to:: Private residence Living Arrangements: Alone (family check 3-4 a day on her) Available Help at Discharge: Skilled Nursing Facility Type of Home:  House Home Access: Stairs to enter   Home Layout: Two level (Pt stay only on first - room and bathrrom)        Prior Function Level of Independence: Independent      Comments: with ADL's but family do not know how she did every day with bathing and dressing because of dementia - they brought meal -   PT Goals (current goals can now be found in the care plan section) Acute Rehab PT Goals Patient Stated Goal: to walk PT Goal Formulation: Patient unable to participate in goal setting Time For Goal Achievement: 10/28/19 Potential to Achieve Goals: Fair Progress towards PT goals: Progressing toward goals    Frequency    BID      PT Plan Current plan remains appropriate    Co-evaluation              AM-PAC PT "6 Clicks"  Mobility   Outcome Measure  Help needed turning from your back to your side while in a flat bed without using bedrails?: A Little Help needed moving from lying on your back to sitting on the side of a flat bed without using bedrails?: A Lot Help needed moving to and from a bed to a chair (including a wheelchair)?: A Lot Help needed standing up from a chair using your arms (e.g., wheelchair or bedside chair)?: A Lot Help needed to walk in hospital room?: A Lot Help needed climbing 3-5 steps with a railing? : A Lot 6 Click Score: 13    End of Session Equipment Utilized During Treatment: Gait belt Activity Tolerance: Patient tolerated treatment well Patient left: in bed;with call bell/phone within reach;with bed alarm set Nurse Communication: Mobility status PT Visit Diagnosis: Unsteadiness on feet (R26.81);Muscle weakness (generalized) (M62.81);History of falling (Z91.81);Difficulty in walking, not elsewhere classified (R26.2)     Time: 9563-8756 PT Time Calculation (min) (ACUTE ONLY): 25 min  Charges:  $Gait Training: 8-22 mins $Therapeutic Activity: 8-22 mins                        Jones Broom S, PT DPT 10/14/2019, 3:10 PM

## 2019-10-14 NOTE — Progress Notes (Addendum)
Physical Therapy Evaluation Patient Details Name: Kathy Mata MRN: [id number] DOB: 01-Mar-1924 Today's Date: 10/14/2019   History of Present Illness  Pateint is s/p INTRAMEDULLARY (IM) NAIL INTERTROCHANTRIC (Left) with I&D of open fracture  Clinical Impression  Patient agrees to PT eval. She has -2/5 LLE hip flex and  Lknee 3/5 extension strength. She has good sitting balance and fair standing balance with UE support with RW. She performs BLE LAQ and hip flex exercises x 10.  She is mod assist with bed mobility, min assist for transfers sit to stand with RW. She ambulates  feet with min assist. She has fatigue following gait. Patient will continue to benefit from skilled PT to improve mobility and strength.    Follow Up Recommendations SNF    Equipment Recommendations  Rolling walker with 5" wheels    Recommendations for Other Services       Precautions / Restrictions Restrictions Weight Bearing Restrictions: Yes Other Position/Activity Restrictions: WBAT LLE      Mobility  Bed Mobility Overal bed mobility: Needs Assistance Bed Mobility: Supine to Sit;Sit to Supine     Supine to sit: Mod assist Sit to supine: Mod assist   General bed mobility comments: need VC for sequencing  Transfers Overall transfer level: Needs assistance Equipment used: Rolling walker (2 wheeled)                Ambulation/Gait Ambulation/Gait assistance:  (unable to WB on LLE to take a step)              Stairs            Wheelchair Mobility    Modified Rankin (Stroke Patients Only)       Balance Overall balance assessment: Needs assistance Sitting-balance support: Bilateral upper extremity supported Sitting balance-Leahy Scale: Good     Standing balance support: Bilateral upper extremity supported Standing balance-Leahy Scale: Fair                               Pertinent Vitals/Pain Pain Assessment: Faces Faces Pain Scale: Hurts little more     Home Living Family/patient expects to be discharged to:: Unsure Living Arrangements: Alone                    Prior Function Level of Independence:  (unknown)               Hand Dominance        Extremity/Trunk Assessment   Upper Extremity Assessment Upper Extremity Assessment: Overall WFL for tasks assessed    Lower Extremity Assessment Lower Extremity Assessment: LLE deficits/detail LLE Deficits / Details:  (-3/5 hip, 3/5 knee ext)       Communication   Communication: HOH  Cognition Arousal/Alertness: Awake/alert Behavior During Therapy: WFL for tasks assessed/performed                                          General Comments      Exercises     Assessment/Plan    PT Assessment Patient needs continued PT services  PT Problem List Decreased strength;Decreased activity tolerance;Decreased balance       PT Treatment Interventions Gait training;Therapeutic activities;Therapeutic exercise;Balance training    PT Goals (Current goals can be found in the Care Plan section)  Acute Rehab PT Goals Patient Stated Goal: to walk  PT Goal Formulation: Patient unable to participate in goal setting Time For Goal Achievement: 10/28/19 Potential to Achieve Goals: Fair    Frequency BID   Barriers to discharge Decreased caregiver support      Co-evaluation               AM-PAC PT "6 Clicks" Mobility  Outcome Measure Help needed turning from your back to your side while in a flat bed without using bedrails?: A Little Help needed moving from lying on your back to sitting on the side of a flat bed without using bedrails?: A Lot Help needed moving to and from a bed to a chair (including a wheelchair)?: A Lot Help needed standing up from a chair using your arms (e.g., wheelchair or bedside chair)?: A Lot Help needed to walk in hospital room?: A Lot Help needed climbing 3-5 steps with a railing? : Total 6 Click Score: 12    End of  Session Equipment Utilized During Treatment: Gait belt Activity Tolerance: Patient limited by fatigue;Patient limited by pain Patient left: in bed;with bed alarm set Nurse Communication: Mobility status PT Visit Diagnosis: Unsteadiness on feet (R26.81);Muscle weakness (generalized) (M62.81);Ataxic gait (R26.0);Difficulty in walking, not elsewhere classified (R26.2)    Time: 8182-9937 PT Time Calculation (min) (ACUTE ONLY): 25 min   Charges:   PT Evaluation $PT Eval Low Complexity: 1 Low PT Treatments $Therapeutic Activity: 8-22 mins          Jones Broom S PT DPT 10/14/2019, 11:37 AM

## 2019-10-14 NOTE — Anesthesia Postprocedure Evaluation (Signed)
Anesthesia Post Note  Patient: Kathy Mata  Procedure(s) Performed: INTRAMEDULLARY (IM) NAIL INTERTROCHANTRIC (Left )  Patient location during evaluation: Nursing Unit Anesthesia Type: Spinal Level of consciousness: oriented and awake and alert Pain management: pain level controlled Vital Signs Assessment: post-procedure vital signs reviewed and stable Respiratory status: spontaneous breathing, respiratory function stable and nonlabored ventilation Cardiovascular status: blood pressure returned to baseline and stable Postop Assessment: no headache, no backache, no apparent nausea or vomiting and patient able to bend at knees Anesthetic complications: no   No complications documented.   Last Vitals:  Vitals:   10/14/19 0313 10/14/19 0800  BP: (!) 131/59 (!) 121/54  Pulse: 79 83  Resp: 18 16  Temp: 36.7 C 36.7 C  SpO2: 98% 96%    Last Pain:  Vitals:   10/14/19 0800  TempSrc: Axillary  PainSc:                  Lenard Simmer

## 2019-10-14 NOTE — Progress Notes (Signed)
Initial Nutrition Assessment  DOCUMENTATION CODES:   Underweight  INTERVENTION:  Ensure Enlive po BID, each supplement provides 350 kcal and 20 grams of protein  NUTRITION DIAGNOSIS:   Increased nutrient needs related to post-op healing, hip fracture as evidenced by estimated needs.   GOAL:   Patient will meet greater than or equal to 90% of their needs    MONITOR:   Labs, Supplement acceptance, PO intake, Weight trends, Skin  REASON FOR ASSESSMENT:   Malnutrition Screening Tool    ASSESSMENT:  RD working remotely.  86 year old female with history of aortic atherosclerosis, PAD, bilateral carotid artery stenosis, HTN, vascular dementia without behavioral disturbances presented with left hip pain after mechanical fall at home and admitted for surgical intervention of left subtrochanteric femur fracture.  Pt is POD#1 Intramedullary IM nailing of left intertrochanteric with I&D of open fracture.   Pt on regular diet, no documented meals at this time. Suspect decreased po intake given recent weight trends, advanced age and dementia. Will order Ensure supplement to aid with meeting increased needs to support post-op wound healing.  Per chart, weights have trended down ~12 lbs (11.5%) over the last 3 months and 4 lbs (4.4%) in the last month; significant. Pt is underweight, given advanced age as well as vascular dementia, highly suspect malnutrition however unable to identify at this time. Will plan to perform exam at follow-up.  I/Os: +1976.7 ml since admit UOP: 720 ml x 24 hrs  Medications reviewed and include: Colace, Aricept, Protonix IVPB: Ancef  IVF: NaCl @ 75 ml/hr Labs: Na 133 (L), WBC 10.8 (H), Hgb 6.2 (L), HCT 19.0 (L), RBC 1.99 (L)   NUTRITION - FOCUSED PHYSICAL EXAM: Unable to complete at this time, RD working remotely.  Diet Order:   Diet Order            Diet regular Room service appropriate? Yes; Fluid consistency: Thin  Diet effective now                  EDUCATION NEEDS:   Not appropriate for education at this time  Skin:  Skin Assessment: Skin Integrity Issues: Skin Integrity Issues:: Incisions, Other (Comment) Incisions: closed; left hip Other: abraison;left arm  Last BM:  pta  Height:   Ht Readings from Last 1 Encounters:  10/13/19 5\' 5"  (1.651 m)    Weight:   Wt Readings from Last 1 Encounters:  10/13/19 41.6 kg    BMI:  Body mass index is 15.26 kg/m.  Estimated Nutritional Needs:   Kcal:  1300-1450  Protein:  60-70  Fluid:  > 1.2 L/day   10/15/19, RD, LDN Clinical Nutrition After Hours/Weekend Pager # in Amion

## 2019-10-14 NOTE — Plan of Care (Signed)
m °

## 2019-10-14 NOTE — Evaluation (Signed)
Occupational Therapy Evaluation Patient Details Name: Kathy Mata MRN: [id number] DOB: 29-Dec-1924 Today's Date: 10/14/2019    History of Present Illness Pateint is s/p INTRAMEDULLARY (IM) NAIL INTERTROCHANTRIC (Left) with I&D of open fracture by Dr Rosita Kea   Clinical Impression   Pt is pleasant 23 yrs lady - found in bed - D-I-L at side of bed - report pt lives alone but she and her husband checks on her daily - brings food. Per daughter in law not sure how of a job she done daily on bathing and dressing but did not need physical help. She only left the house with them. She only stayed downstairs - where her room and bathroom with tubshower combo was. Things that was where her fall happened - wants her to go to SNF for rehab. Pt show OT deficits as listed in " OT problem list"  - causing her decrease independence in balance, functional mobiilty in ADL's and IADL's - pt had pinning of hip -will attempt to do AE trng - but because of her dementia and memory would maybe to able to retain AE trng- pt can benefit from skilled  OT services    Follow Up Recommendations  SNF    Equipment Recommendations  3 in 1 bedside commode;Tub/shower seat;Tub/shower bench    Recommendations for Other Services PT consult     Precautions / Restrictions Precautions Precautions: Fall Restrictions Weight Bearing Restrictions: Yes Other Position/Activity Restrictions: WBAT LLE      Mobility Bed Mobility Overal bed mobility: Needs Assistance Bed Mobility: Supine to Sit;Sit to Supine     Supine to sit: Min guard Sit to supine: Min guard   General bed mobility comments: min A for hand placement and L LE  Transfers Overall transfer level: Needs assistance Equipment used: Rolling walker (2 wheeled)             General transfer comment: Min to Mod A for transfers and sit<> stand - hand placement    Balance Overall balance assessment: Needs assistance Sitting-balance support: No upper extremity  supported Sitting balance-Leahy Scale: Good Sitting balance - Comments: UB dressing and R LE dressing no LOB   Standing balance support: Bilateral upper extremity supported Standing balance-Leahy Scale: Fair                             ADL either performed or assessed with clinical judgement   ADL                                         General ADL Comments: With setup UB dressing and bathing SBA, LB dressing independent wtih R LE - but max A for L LE standing for bathing; grooming and eating I with setup     Vision Baseline Vision/History: Wears glasses Wears Glasses: Reading only       Perception     Praxis      Pertinent Vitals/Pain Pain Assessment: No/denies pain Faces Pain Scale: Hurts little more     Hand Dominance Right   Extremity/Trunk Assessment Upper Extremity Assessment Upper Extremity Assessment: Overall WFL for tasks assessed   Lower Extremity Assessment Lower Extremity Assessment: Defer to PT evaluation LLE Deficits / Details:  (-3/5 hip, 3/5 knee ext)       Communication Communication Communication: HOH   Cognition Arousal/Alertness: Awake/alert Behavior During Therapy: WFL for tasks assessed/performed  General Comments: Per D-I-L pt dementia and - stay alone but they checc on her 3-4 x day - bring food - pt did not remember she broked her leg -she says she still line dance , and do everything her self at home -per pt  cooking   General Comments       Exercises Other Exercises Other Exercises: LB dresing pt able to donn sock min A - doff Independent - side stepping to scoot up in bed S to CG -   Shoulder Instructions      Home Living Family/patient expects to be discharged to:: Private residence Living Arrangements: Alone (family check 3-4 a day on her) Available Help at Discharge: Skilled Nursing Facility Type of Home: House Home Access: Stairs to  enter [employer] of Steps: 4 in front or from the garage   Home Layout: Two level (Pt stay only on first - room and bathrrom)     Bathroom Shower/Tub: Chief Strategy Officer: Standard                Prior Functioning/Environment Level of Independence: Independent        Comments: with ADL's but family do not know how she did every day with bathing and dressing because of dementia - they brought meal -        OT Problem List: Decreased strength;Decreased activity tolerance;Impaired balance (sitting and/or standing);Decreased safety awareness;Decreased knowledge of precautions;Decreased knowledge of use of DME or AE      OT Treatment/Interventions: Self-care/ADL training;Patient/family education;Neuromuscular education;Balance training;Therapeutic activities;DME and/or AE instruction    OT Goals(Current goals can be found in the care plan section) Acute Rehab OT Goals Patient Stated Goal: Want by leg better OT Goal Formulation: With patient/family Time For Goal Achievement: 10/27/19 Potential to Achieve Goals: Good  OT Frequency: Min 2X/week   Barriers to D/C:            Co-evaluation              AM-PAC OT "6 Clicks" Daily Activity     Outcome Measure Help from another person eating meals?: A Little Help from another person taking care of personal grooming?: A Little Help from another person toileting, which includes using toliet, bedpan, or urinal?: A Lot Help from another person bathing (including washing, rinsing, drying)?: A Lot Help from another person to put on and taking off regular upper body clothing?: A Little Help from another person to put on and taking off regular lower body clothing?: A Lot 6 Click Score: 15   End of Session Equipment Utilized During Treatment: Gait belt;Rolling walker  Activity Tolerance: Patient tolerated treatment well Patient left: in bed;with call bell/phone within reach;with bed alarm set;with  family/visitor present  OT Visit Diagnosis: Unsteadiness on feet (R26.81);Muscle weakness (generalized) (M62.81);History of falling (Z91.81);Other symptoms and signs involving cognitive function                Time: 1129-1200 OT Time Calculation (min): 31 min Charges:  OT Evaluation $OT Eval Low Complexity: 1 Low OT Treatments $Self Care/Home Management : 8-22 mins  }  Oletta Cohn OTR/L,CLT 10/14/2019, 12:42 PM

## 2019-10-14 NOTE — Anesthesia Post-op Follow-up Note (Signed)
  Anesthesia Pain Follow-up Note  Patient: Zada Haser  Day #: 1  Date of Follow-up: 10/14/2019 Time: 11:39 AM  Last Vitals:  Vitals:   10/14/19 0313 10/14/19 0800  BP: (!) 131/59 (!) 121/54  Pulse: 79 83  Resp: 18 16  Temp: 36.7 C 36.7 C  SpO2: 98% 96%    Level of Consciousness: alert  Pain: mild   Side Effects:None  Catheter Site Exam:clean, dry  Anti-Coag Meds (From admission, onward)   Start     Dose/Rate Route Frequency Ordered Stop   10/14/19 0800  enoxaparin (LOVENOX) injection 30 mg        30 mg Subcutaneous Every 24 hours 10/13/19 1913         Plan: D/C from anesthesia care at surgeon's request  Lenard Simmer

## 2019-10-14 NOTE — Progress Notes (Signed)
   Subjective: 1 Day Post-Op Procedure(s) (LRB): INTRAMEDULLARY (IM) NAIL INTERTROCHANTRIC (Left) Patient reports pain as mild.   Patient is well, and has had no acute complaints or problems Denies any CP, SOB, ABD pain. We will start physical therapy today.   Objective: Vital signs in last 24 hours: Temp:  [96.8 F (36 C)-99.1 F (37.3 C)] 98 F (36.7 C) (09/25 0313) Pulse Rate:  [28-160] 79 (09/25 0313) Resp:  [9-55] 18 (09/25 0313) BP: (85-181)/(28-140) 131/59 (09/25 0313) SpO2:  [88 %-100 %] 98 % (09/25 0313) Weight:  [41.6 kg] 41.6 kg (09/24 0848)  Intake/Output from previous day: 09/24 0701 - 09/25 0700 In: 2726.7 [I.V.:2676.7; IV Piggyback:50] Out: 750 [Urine:720; Blood:30] Intake/Output this shift: No intake/output data recorded.  Recent Labs    10/13/19 0859  HGB 9.4*   Recent Labs    10/13/19 0859  WBC 19.4*  RBC 3.01*  HCT 28.8*  PLT 133*   Recent Labs    10/13/19 0859  NA 138  K 3.6  CL 106  CO2 25  BUN 20  CREATININE 0.77  GLUCOSE 134*  CALCIUM 8.2*   No results for input(s): LABPT, INR in the last 72 hours.  EXAM General - Patient is Alert, Appropriate and Confused Extremity - Neurovascular intact Sensation intact distally Intact pulses distally Dorsiflexion/Plantar flexion intact No cellulitis present Compartment soft Dressing - dressing C/D/I and no drainage, Praveena intact without drainage Motor Function - intact, moving foot and toes well on exam.   Past Medical History:  Diagnosis Date  . Hypertension   . Memory deficit     Assessment/Plan:   1 Day Post-Op Procedure(s) (LRB): INTRAMEDULLARY (IM) NAIL INTERTROCHANTRIC (Left) Active Problems:   Open femur fracture, left (HCC)  Estimated body mass index is 15.26 kg/m as calculated from the following:   Height as of this encounter: 5\' 5"  (1.651 m).   Weight as of this encounter: 41.6 kg. Advance diet Up with therapy  Continue with IV antibiotics Vital signs are  stable Pain well controlled. CBC and BMP pending Care management to assist with discharge  DVT Prophylaxis - Lovenox, TED hose and SCDs Weight-Bearing as tolerated to left leg   T. , PA-C [facility] 10/14/2019, 7:01 AM

## 2019-10-15 LAB — CBC
HCT: 25.5 % — ABNORMAL LOW (ref 36.0–46.0)
Hemoglobin: 8.9 g/dL — ABNORMAL LOW (ref 12.0–15.0)
MCH: 31.7 pg (ref 26.0–34.0)
MCHC: 34.9 g/dL (ref 30.0–36.0)
MCV: 90.7 fL (ref 80.0–100.0)
Platelets: 89 10*3/uL — ABNORMAL LOW (ref 150–400)
RBC: 2.81 MIL/uL — ABNORMAL LOW (ref 3.87–5.11)
RDW: 13.8 % (ref 11.5–15.5)
WBC: 13.8 10*3/uL — ABNORMAL HIGH (ref 4.0–10.5)
nRBC: 0 % (ref 0.0–0.2)

## 2019-10-15 LAB — BPAM RBC
Blood Product Expiration Date: 202110222359
Blood Product Expiration Date: 202110222359
ISSUE DATE / TIME: 202109251627
ISSUE DATE / TIME: 202109252037
Unit Type and Rh: 6200
Unit Type and Rh: 6200

## 2019-10-15 LAB — TYPE AND SCREEN
ABO/RH(D): A POS
Antibody Screen: NEGATIVE
Unit division: 0
Unit division: 0

## 2019-10-15 MED ORDER — MAGNESIUM HYDROXIDE 400 MG/5ML PO SUSP
30.0000 mL | Freq: Once | ORAL | Status: AC
  Administered 2019-10-15: 30 mL via ORAL
  Filled 2019-10-15: qty 30

## 2019-10-15 NOTE — Progress Notes (Signed)
Physical Therapy Treatment Patient Details Name: Kathy Mata MRN: [id number] DOB: [birth year] Today's Date: 10/15/2019    History of Present Illness Pateint is s/p INTRAMEDULLARY (IM) NAIL INTERTROCHANTRIC (Left) with I&D of open fracture by Dr Rosita Kea    PT Comments    .Participated in exercises as described below.  To EOB with min a x 1 and tactile/demo cues.  Sitting with min guard.  Stood with min a x 1 and is able to transfer to recliner and bedside with min a x 1.  Some buckling noted today with stepping L LE.  Standing marches, SLR x 20.  Remained in recliner after session.   Follow Up Recommendations  SNF     Equipment Recommendations  Rolling walker with 5" wheels    Recommendations for Other Services       Precautions / Restrictions Precautions Precautions: Fall Restrictions Weight Bearing Restrictions: Yes LLE Weight Bearing: Weight bearing as tolerated Other Position/Activity Restrictions: WBAT LLE    Mobility  Bed Mobility Overal bed mobility: Needs Assistance Bed Mobility: Supine to Sit     Supine to sit: Min assist     General bed mobility comments: difficulty following verbal cues due to hearing,  does better with demo and tactile cues  Transfers Overall transfer level: Needs assistance Equipment used: Rolling walker (2 wheeled) Transfers: Sit to/from Stand           General transfer comment: Min to Mod A for transfers and sit<> stand - hand placement  Ambulation/Gait Ambulation/Gait assistance: Min assist;Mod assist Gait Distance (Feet): 3 Feet Assistive device: Rolling walker (2 wheeled)           Stairs             Wheelchair Mobility    Modified Rankin (Stroke Patients Only)       Balance Overall balance assessment: Needs assistance Sitting-balance support: No upper extremity supported Sitting balance-Leahy Scale: Fair     Standing balance support: Bilateral upper extremity supported Standing balance-Leahy Scale:  Poor                              Cognition Arousal/Alertness: Awake/alert Behavior During Therapy: WFL for tasks assessed/performed                                   General Comments: Per D-I-L pt dementia and - stay alone but they checc on her 3-4 x day - bring food - pt did not remember she broked her leg -she says she still line dance , and do everything her self at home -per pt  cooking      Exercises Other Exercises Other Exercises: BLE hip ROM ex in supine x 10 AAROM Other Exercises: Transfer to commode Min A    General Comments        Pertinent Vitals/Pain Pain Assessment: Faces Faces Pain Scale: Hurts a little bit Pain Descriptors / Indicators: Sore;Operative site guarding Pain Intervention(s): Limited activity within patient's tolerance;Monitored during session;Repositioned    Home Living Family/patient expects to be discharged to:: Private residence Living Arrangements: Alone Available Help at Discharge: Skilled Nursing Facility Type of Home: House     Home Layout: Two level        Prior Function            PT Goals (current goals can now be found in the care plan section)  Acute Rehab PT Goals Patient Stated Goal: to walk Progress towards PT goals: Progressing toward goals    Frequency    BID      PT Plan Current plan remains appropriate    Co-evaluation              AM-PAC PT "6 Clicks" Mobility   Outcome Measure    Help needed moving from lying on your back to sitting on the side of a flat bed without using bedrails?: A Lot Help needed moving to and from a bed to a chair (including a wheelchair)?: A Lot Help needed standing up from a chair using your arms (e.g., wheelchair or bedside chair)?: A Lot Help needed to walk in hospital room?: A Lot Help needed climbing 3-5 steps with a railing? : Total 6 Click Score: 9    End of Session Equipment Utilized During Treatment: Gait belt Activity Tolerance:  Patient tolerated treatment well Patient left: in chair;with call bell/phone within reach;with chair alarm set;with family/visitor present Nurse Communication: Mobility status       Time: 3817-7116 PT Time Calculation (min) (ACUTE ONLY): 23 min  Charges:  $Therapeutic Exercise: 8-22 mins $Therapeutic Activity: 8-22 mins                    Danielle Dess, PTA 10/15/19, 12:17 PM

## 2019-10-15 NOTE — Progress Notes (Signed)
Pty laying in bed, Scheduled meds given. Pt hartd of hearing and forgetful/confused. Call bell within reach, RN number on board. Bed alarm on.

## 2019-10-15 NOTE — TOC Initial Note (Signed)
Transition of Care [facility]) - Initial/Assessment Note    Patient Details  Name: Yaniah Thiemann MRN: [id number] Date of Birth: 19-Sep-1924  [facility]) CM/SW Contact:    Maud Deed, LCSW Phone Number: 10/15/2019, 12:25 PM  Clinical Narrative:                 CSW spoke with pt's daughter [name] about discharge plans and PT recommendations. [name] was on board with SNF and prefers [facility] or [facility] as a last resort. Pt's daughter does not want her to go to [facility] or [facility]. CSW explained the likelihood  of her being accepted at [facility] or [facility] since the pt was not a community member would be low and she understood. CSW faxed to family's  preferences as well as other surrounding facilities not mentioned.   CSW completed PASRR review and additional information was needed, CSW was unable to fax necessary documents at this time due to not having H&P in chart. CSW requested H&P from attending and will submit once received. PASRR pending.   Expected Discharge Plan: Skilled Nursing Facility Barriers to Discharge: Continued Medical Work up   Patient Goals and CMS Choice Patient states their goals for this hospitalization and ongoing recovery are:: to finish rehab and go home CMS Medicare.gov Compare Post Acute Care list provided to:: Patient Represenative (must comment) Choice offered to / list presented to : Adult Children  Expected Discharge Plan and Services Expected Discharge Plan: Skilled Nursing Facility     Post Acute Care Choice: Skilled Nursing Facility Living arrangements for the past 2 months: Single Family Home                                      Prior Living Arrangements/Services Living arrangements for the past 2 months: Single Family Home Lives with:: Self Patient language and need for interpreter reviewed:: Yes        Need for Family Participation in Patient Care: Yes (Comment) Care giver support system in place?: Yes (comment)  (adult children)   Criminal Activity/Legal Involvement Pertinent to Current Situation/Hospitalization: No - Comment as needed  Activities of Daily Living Home Assistive Devices/Equipment: None ADL Screening (condition at time of admission) Patient's cognitive ability adequate to safely complete daily activities?: No Is the patient deaf or have difficulty hearing?: Yes Does the patient have difficulty seeing, even when wearing glasses/contacts?: No Does the patient have difficulty concentrating, remembering, or making decisions?: Yes Patient able to express need for assistance with ADLs?: Yes Does the patient have difficulty dressing or bathing?: Yes Independently performs ADLs?: No Communication: Independent Dressing (OT): Independent, Needs assistance Is this a change from baseline?: Change from baseline, expected to last <3days Grooming: Needs assistance Is this a change from baseline?: Change from baseline, expected to last <3 days Feeding: Independent Bathing: Independent Toileting: Needs assistance Is this a change from baseline?: Change from baseline, expected to last <3 days In/Out Bed: Needs assistance Is this a change from baseline?: Change from baseline, expected to last <3 days Walks in Home: Independent Does the patient have difficulty walking or climbing stairs?: Yes Weakness of Legs: Left Weakness of Arms/Hands: None  Permission Sought/Granted Permission sought to share information with : Facility Industrial/product designer granted to share information with : Yes, Verbal Permission Granted  Share Information with NAME: [name]     Permission granted to share info w  Relationship: daughter  Permission granted to share info w Contact Information: [phone]  Emotional Assessment Appearance:: Other (Comment Required (unable to assess) Attitude/Demeanor/Rapport: Unable to Assess Affect (typically observed): Unable to Assess Orientation: : Oriented to  Self Alcohol / Substance Use: Not Applicable Psych Involvement: No (comment)  Admission diagnosis:  Fall [W19.XXXA] Fall, initial encounter [id number]] Closed left subtrochanteric femur fracture, initial encounter (HCC) [S72.22XA] Open femur fracture, left (HCC) [S72.92XB] Patient Active Problem List   Diagnosis Date Noted  . Open femur fracture, left (HCC) 10/13/2019  . DNR (do not resuscitate) 09/18/2019  . Depression with anxiety 09/11/2019  . Aortic atherosclerosis (HCC) 07/16/2019  . PAD (peripheral artery disease) (HCC) 07/06/2019  . Bilateral carotid artery stenosis 07/06/2019  . Acquired hypothyroidism 06/22/2019  . Essential hypertension 06/22/2019  . Vascular dementia with behavior disturbance (HCC) 06/22/2019   PCP:  [name], DO Pharmacy:   [facility] - [place], [place] - [address] [place] [address] Phone: [phone] Fax: [phone]  [facility] [address], [place] - [address] [place] [address] Phone: [phone] Fax: [phone]     Social Determinants of Health (SDOH) Interventions    Readmission Risk Interventions No flowsheet data found.

## 2019-10-15 NOTE — NC FL2 (Signed)
[place] MEDICAID FL2 LEVEL OF CARE SCREENING TOOL     IDENTIFICATION  Patient Name: Kathy Mata Birthdate: 1924/08/19 Sex: female Admission Date (Current Location): 10/13/2019  [place] and [place] Number:  Chiropodist and Address:  [facility], [address] [place], [place] [address]      Provider Number: [id number]  Attending Physician Name and Address:  Kennedy Bucker, MD  Relative Name and Phone Number:  [name] [phone]    Current Level of Care: Hospital Recommended Level of Care: Skilled Nursing Facility Prior Approval Number:    Date Approved/Denied:   PASRR Number:    Discharge Plan: SNF    Current Diagnoses: Patient Active Problem List   Diagnosis Date Noted  . Open femur fracture, left (HCC) 10/13/2019  . DNR (do not resuscitate) 09/18/2019  . Depression with anxiety 09/11/2019  . Aortic atherosclerosis (HCC) 07/16/2019  . PAD (peripheral artery disease) (HCC) 07/06/2019  . Bilateral carotid artery stenosis 07/06/2019  . Acquired hypothyroidism 06/22/2019  . Essential hypertension 06/22/2019  . Vascular dementia with behavior disturbance (HCC) 06/22/2019    Orientation RESPIRATION BLADDER Height & Weight     Self  Normal Continent Weight: 91 lb 11.2 oz (41.6 kg) Height:  5\' 5"  (165.1 cm)  BEHAVIORAL SYMPTOMS/MOOD NEUROLOGICAL BOWEL NUTRITION STATUS      Continent Diet  AMBULATORY STATUS COMMUNICATION OF NEEDS Skin   Extensive Assist Verbally Normal, Surgical wounds (Left femur fracture)                       Personal Care Assistance Level of Assistance  Bathing, Feeding, Dressing Bathing Assistance: Maximum assistance Feeding assistance: Limited assistance Dressing Assistance: Maximum assistance     Functional Limitations Info  Sight, Hearing, Speech Sight Info: Adequate Hearing Info: Adequate Speech Info: Adequate    SPECIAL CARE FACTORS FREQUENCY  PT (By licensed PT), OT (By licensed OT)      PT Frequency: 5x week OT Frequency: 5x week            Contractures Contractures Info: Not present    Additional Factors Info  Code Status Code Status Info: DNR             Current Medications (10/15/2019):  This is the current hospital active medication list Current Facility-Administered Medications  Medication Dose Route Frequency Provider Last Rate Last Admin  . 0.9 %  sodium chloride infusion   Intravenous Continuous 10/17/2019, MD 50 mL/hr at 10/15/19 0300 Rate Verify at 10/15/19 0300  . 0.9 %  sodium chloride infusion   Intravenous Continuous 10/17/19, MD   Stopped at 10/15/19 0010  . acetaminophen (TYLENOL) tablet 325-650 mg  325-650 mg Oral Q6H PRN 10/17/19, MD   650 mg at 10/15/19 0209  . alum & mag hydroxide-simeth (MAALOX/MYLANTA) 200-200-20 MG/5ML suspension 30 mL  30 mL Oral Q4H PRN 07-28-2000, MD      . bisacodyl (DULCOLAX) suppository 10 mg  10 mg Rectal Daily PRN Kennedy Bucker, MD      . ceFAZolin (ANCEF) IVPB 1 g/50 mL premix  1 g Intravenous Q8H Kennedy Bucker, MD 100 mL/hr at 10/15/19 0829 1 g at 10/15/19 0829  . docusate sodium (COLACE) capsule 100 mg  100 mg Oral BID 10/17/19, MD   100 mg at 10/15/19 0827  . donepezil (ARICEPT) tablet 10 mg  10 mg Oral QHS 10/17/19, MD   10 mg at 10/14/19 2118  . enoxaparin (LOVENOX) injection 30  mg  30 mg Subcutaneous Q24H Kennedy Bucker, MD   30 mg at 10/15/19 0827  . feeding supplement (ENSURE ENLIVE) (ENSURE ENLIVE) liquid 237 mL  237 mL Oral BID BM Kennedy Bucker, MD      . flumazenil (ROMAZICON) injection 0.5 mg  0.5 mg Intravenous Once Kennedy Bucker, MD      . HYDROcodone-acetaminophen (NORCO/VICODIN) 5-325 MG per tablet 1-2 tablet  1-2 tablet Oral Q4H PRN Kennedy Bucker, MD   2 tablet at 10/15/19 0404  . influenza vaccine adjuvanted (FLUAD) injection 0.5 mL  0.5 mL Intramuscular Tomorrow-1000 Kennedy Bucker, MD      . lactated ringers infusion   Intravenous Continuous Kennedy Bucker, MD 400 mL/hr at  10/13/19 1428 New Bag at 10/13/19 1428  . levothyroxine (SYNTHROID) tablet 75 mcg  75 mcg Oral Daily Kennedy Bucker, MD   75 mcg at 10/15/19 0555  . LORazepam (ATIVAN) injection 1 mg  1 mg Intravenous Q4H PRN Kennedy Bucker, MD   1 mg at 10/13/19 2047  . magnesium citrate solution 1 Bottle  1 Bottle Oral Once PRN Kennedy Bucker, MD      . magnesium hydroxide (MILK OF MAGNESIA) suspension 30 mL  30 mL Oral Daily PRN Kennedy Bucker, MD      . magnesium hydroxide (MILK OF MAGNESIA) suspension 30 mL  30 mL Oral Once Evon Slack, PA-C      . menthol-cetylpyridinium (CEPACOL) lozenge 3 mg  1 lozenge Oral PRN Kennedy Bucker, MD       Or  . phenol (CHLORASEPTIC) mouth spray 1 spray  1 spray Mouth/Throat PRN Kennedy Bucker, MD      . methocarbamol (ROBAXIN) tablet 500 mg  500 mg Oral Q6H PRN Kennedy Bucker, MD       Or  . methocarbamol (ROBAXIN) 500 mg in dextrose 5 % 50 mL IVPB  500 mg Intravenous Q6H PRN Kennedy Bucker, MD      . metoCLOPramide (REGLAN) tablet 5-10 mg  5-10 mg Oral Q8H PRN Kennedy Bucker, MD       Or  . metoCLOPramide (REGLAN) injection 5-10 mg  5-10 mg Intravenous Q8H PRN Kennedy Bucker, MD      . metoprolol succinate (TOPROL-XL) 24 hr tablet 25 mg  25 mg Oral Daily Kennedy Bucker, MD   25 mg at 10/15/19 0826  . morphine 2 MG/ML injection 0.5-1 mg  0.5-1 mg Intravenous Q2H PRN Kennedy Bucker, MD      . naloxone [facility]) injection 0.4 mg  0.4 mg Intravenous Once Kennedy Bucker, MD      . ondansetron [facility]) tablet 4 mg  4 mg Oral Q6H PRN Kennedy Bucker, MD       Or  . ondansetron [facility]) injection 4 mg  4 mg Intravenous Q6H PRN Kennedy Bucker, MD   4 mg at 10/15/19 0902  . pantoprazole (PROTONIX) EC tablet 40 mg  40 mg Oral Daily Kennedy Bucker, MD   40 mg at 10/15/19 0827  . traZODone (DESYREL) tablet 50 mg  50 mg Oral QHS Kennedy Bucker, MD   50 mg at 10/14/19 2118  . zolpidem (AMBIEN) tablet 5 mg  5 mg Oral QHS PRN Kennedy Bucker, MD         Discharge Medications: Please see discharge  summary for a list of discharge medications.  Relevant Imaging Results:  Relevant Lab Results:   Additional Information SS [id number]  Maud Deed, LCSW

## 2019-10-15 NOTE — Progress Notes (Signed)
Pt "sick on stoamach", yelling and gagging. RN gave PRN med zofran. Pt states she feels better. Pt denies any further needs at this time. Call bell within reach. Bed alarm on

## 2019-10-15 NOTE — Progress Notes (Addendum)
   Subjective: 2 Days Post-Op Procedure(s) (LRB): INTRAMEDULLARY (IM) NAIL INTERTROCHANTRIC (Left) Patient reports pain as mild.   Patient is well, and has had no acute complaints or problems  Patient's daughter present, states dementia is worse than baseline. Denies any CP, SOB, ABD pain. We will continue with physical therapy today.   Objective: Vital signs in last 24 hours: Temp:  [97.6 F (36.4 C)-98.8 F (37.1 C)] 98 F (36.7 C) (09/26 0754) Pulse Rate:  [68-81] 68 (09/26 0754) Resp:  [16-20] 16 (09/26 0754) BP: (114-141)/(44-64) 121/46 (09/26 0754) SpO2:  [95 %-99 %] 96 % (09/26 0754)  Intake/Output from previous day: 09/25 0701 - 09/26 0700 In: 2922.5 [I.V.:1702.4; Blood:1070; IV Piggyback:150] Out: -  Intake/Output this shift: No intake/output data recorded.  Recent Labs    10/13/19 0859 10/14/19 0626 10/15/19 0515  HGB 9.4* 6.2* 8.9*   Recent Labs    10/14/19 0626 10/15/19 0515  WBC 10.8* 13.8*  RBC 1.99* 2.81*  HCT 19.0* 25.5*  PLT 126* 89*   Recent Labs    10/13/19 0859 10/14/19 0626  NA 138 133*  K 3.6 3.8  CL 106 104  CO2 25 25  BUN 20 15  CREATININE 0.77 0.70  GLUCOSE 134* 110*  CALCIUM 8.2* 7.5*   No results for input(s): LABPT, INR in the last 72 hours.  EXAM General - Patient is Alert, Appropriate and Confused Extremity - Neurovascular intact Sensation intact distally Intact pulses distally Dorsiflexion/Plantar flexion intact No cellulitis present Compartment soft Dressing - dressing C/D/I and no drainage, Praveena intact without drainage Motor Function - intact, moving foot and toes well on exam.   Past Medical History:  Diagnosis Date  . Hypertension   . Memory deficit     Assessment/Plan:   2 Days Post-Op Procedure(s) (LRB): INTRAMEDULLARY (IM) NAIL INTERTROCHANTRIC (Left) Active Problems:   Open femur fracture, left (HCC)  Estimated body mass index is 15.26 kg/m as calculated from the following:   Height as of  this encounter: 5\' 5"  (1.651 m).   Weight as of this encounter: 41.6 kg. Advance diet Up with therapy  Continue with IV antibiotics Vital signs are stable Acute post op blood loss anemia -hemoglobin 8.9.  Status post 1 unit packed red blood cells on 10/14/2019 Recheck hemoglobin in the morning Pain well controlled. Dementia -daughter concerned that dementia is worse than normal.  We will try to limit higher doses of Norco if possible.  Will discuss with nursing. Work on bowel movement Care management to assist with discharge to skilled nursing facility  DVT Prophylaxis - Lovenox, TED hose and SCDs Weight-Bearing as tolerated to left leg   T. 10/16/2019, PA-C [facility] 10/15/2019, 10:00 AM

## 2019-10-15 NOTE — Progress Notes (Signed)
Occupational Therapy Treatment Patient Details Name: Kathy Mata MRN: [id number] DOB: 01-12-1925 Today's Date: 10/15/2019    History of present illness Pateint is s/p INTRAMEDULLARY (IM) NAIL INTERTROCHANTRIC (Left) with I&D of open fracture by Dr Rosita Kea   OT comments  Pt is pleasant 49 yrs lady - found in bed this am - only one hearing aid in - reporting she has nausea - threw up couple of times - per nsg they gave her something to take- pt willing to work with OT - gave her some Ginger Ale to sip on at the end. Pt show increase ease with supine <> sit , Sitting balance and transfer min A. Pt can benefit from continues skilled OT services to address OT deficits as listed in " OT problem list"  - causing her decrease independence in balance, functional mobililty in ADL's and IADL's - pt had pinning of hip -will attempt to do AE trng - but because of her dementia and memory would maybe not be able to retain AE trng     Follow Up Recommendations       Equipment Recommendations       Recommendations for Other Services      Precautions / Restrictions Precautions Precautions: Fall Restrictions LLE Weight Bearing: Weight bearing as tolerated Other Position/Activity Restrictions: WBAT LLE       Mobility Bed Mobility                  Transfers                      Balance  Sitting EOB no LOB - and participated in LB dressing  Standing has to hold on walker - did try and hold container to throw up in - using R hand                                          ADL either performed or assessed with clinical judgement   ADL                                               Vision       Perception     Praxis      Cognition Arousal/Alertness: Awake/alert Behavior During Therapy: WFL for tasks assessed/performed                                   General Comments: Per D-I-L pt dementia and - stay alone but they  checc on her 3-4 x day - bring food - pt did not remember she broked her leg -she says she still line dance , and do everything her self at home -per pt  cooking        Exercises Other Exercises Other Exercises: LB dresing pt able to donn sock min A R leg- doff Independent - side stepping to scoot up in bed S to CG - Other Exercises: Transfer to commode Min A   Shoulder Instructions       General Comments      Pertinent Vitals/ Pain       Pain Assessment:  (none stated- but did hurt when sitting up and standing)  Home  Living Family/patient expects to be discharged to:: Private residence Living Arrangements: Alone Available Help at Discharge: Skilled Nursing Facility Type of Home: House   Entrance Stairs-Number of Steps: 4 in front or from the garage   Home Layout: Two level     Bathroom Shower/Tub: Chief Strategy Officer: Standard                Prior Functioning/Environment              Frequency  Min 2X/week        Progress Toward Goals  OT Goals(current goals can now be found in the care plan section)     Acute Rehab OT Goals Patient Stated Goal: to walk OT Goal Formulation: With patient/family Time For Goal Achievement: 10/27/19 Potential to Achieve Goals: Good  Plan      Co-evaluation                 AM-PAC OT "6 Clicks" Daily Activity     Outcome Measure                    End of Session Equipment Utilized During Treatment: Gait belt;Rolling walker  OT Visit Diagnosis: Unsteadiness on feet (R26.81);Muscle weakness (generalized) (M62.81);History of falling (Z91.81);Other symptoms and signs involving cognitive function   Activity Tolerance Patient tolerated treatment well   Patient Left in bed;with call bell/phone within reach;with bed alarm set   Nurse Communication          Time: 0930-1001 OT Time Calculation (min): 31 min  Charges: OT General Charges $OT Visit: 1 Visit OT Treatments $Self  Care/Home Management : 23-37 mins   Jenilyn Magana  OTR/L,CLT 10/15/2019, 11:46 AM

## 2019-10-15 NOTE — H&P (Signed)
Reason for Consult: Left proximal femur fracture Referring Physician: Dr. Darnelle Bos Kathy Mata is an 86 y.o. female.  HPI: Patient is a demented but otherwise healthy 86 year old who suffered a fall this morning.  She had deformity to the proximal femur with the bone close to the skin and I was consulted for further evaluation.  She seen in x-ray department where x-rays show a subtrochanteric fracture with extreme flexion of the proximal fragment.  No evidence of this being a pathologic fracture.  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  No prodromal symptoms that we are aware of.      Past Medical History:  Diagnosis Date  . Hypertension   . Memory deficit          Past Surgical History:  Procedure Laterality Date  . CATARACT EXTRACTION, BILATERAL    . THYROIDECTOMY    . TONSILLECTOMY      No family history on file.  Social History:  reports that she has never smoked. She has never used smokeless tobacco. She reports that she does not drink alcohol and does not use drugs.  Allergies: No Known Allergies  Medications: I have reviewed the patient's current medications.  Lab Results Last 48 Hours  No results found for this or any previous visit (from the past 48 hour(s)).    Imaging Results (Last 48 hours)  No results found.    Review of Systems Blood pressure (!) 148/49, pulse (!) 57, resp. rate 16, height 5\' 5"  (1.651 m), weight 49.9 kg, SpO2 97 %. Physical Exam There is obvious deformity of the proximal thigh with the leg in a hare splint the bone is palpable just under the skin.  She is able to flex extend the toes and has minimal pulses at the foot. Radiographs show proximal femur fracture subtrochanteric with significant displacement Assessment/Plan: Left subtrochanteric femur fracture Plan is for urgent ORIF to prevent this  becoming an open fracture.  10/13/2019, 8:20 AM

## 2019-10-16 ENCOUNTER — Ambulatory Visit: Payer: Self-pay | Admitting: General Practice

## 2019-10-16 ENCOUNTER — Encounter: Payer: Self-pay | Admitting: Orthopedic Surgery

## 2019-10-16 ENCOUNTER — Ambulatory Visit: Payer: Self-pay | Admitting: Licensed Clinical Social Worker

## 2019-10-16 DIAGNOSIS — F03A0 Unspecified dementia, mild, without behavioral disturbance, psychotic disturbance, mood disturbance, and anxiety: Secondary | ICD-10-CM

## 2019-10-16 DIAGNOSIS — F0390 Unspecified dementia without behavioral disturbance: Secondary | ICD-10-CM

## 2019-10-16 DIAGNOSIS — I10 Essential (primary) hypertension: Secondary | ICD-10-CM

## 2019-10-16 DIAGNOSIS — F0151 Vascular dementia with behavioral disturbance: Secondary | ICD-10-CM

## 2019-10-16 DIAGNOSIS — I739 Peripheral vascular disease, unspecified: Secondary | ICD-10-CM

## 2019-10-16 DIAGNOSIS — F01518 Vascular dementia, unspecified severity, with other behavioral disturbance: Secondary | ICD-10-CM

## 2019-10-16 LAB — CBC
HCT: 23.7 % — ABNORMAL LOW (ref 36.0–46.0)
Hemoglobin: 8.2 g/dL — ABNORMAL LOW (ref 12.0–15.0)
MCH: 31.4 pg (ref 26.0–34.0)
MCHC: 34.6 g/dL (ref 30.0–36.0)
MCV: 90.8 fL (ref 80.0–100.0)
Platelets: 101 10*3/uL — ABNORMAL LOW (ref 150–400)
RBC: 2.61 MIL/uL — ABNORMAL LOW (ref 3.87–5.11)
RDW: 14.3 % (ref 11.5–15.5)
WBC: 13.8 10*3/uL — ABNORMAL HIGH (ref 4.0–10.5)
nRBC: 0 % (ref 0.0–0.2)

## 2019-10-16 MED ORDER — ENOXAPARIN SODIUM 30 MG/0.3ML ~~LOC~~ SOLN: 30.0000 mg | SUBCUTANEOUS | 0 refills | Status: AC

## 2019-10-16 MED ORDER — FE FUMARATE-B12-VIT C-FA-IFC PO CAPS
1.0000 | ORAL_CAPSULE | Freq: Two times a day (BID) | ORAL | Status: DC
  Administered 2019-10-16 – 2019-10-19 (×5): 1 via ORAL
  Filled 2019-10-16 (×9): qty 1

## 2019-10-16 MED ORDER — HYDROCODONE-ACETAMINOPHEN 5-325 MG PO TABS: 1.0000 | ORAL_TABLET | ORAL | 0 refills | Status: AC | PRN

## 2019-10-16 NOTE — Discharge Instructions (Signed)
Diet: As you were doing prior to hospitalization  ° °Shower:  May shower but keep the wounds dry, use an occlusive plastic wrap, NO SOAKING IN TUB.  If the bandage gets wet, change with a clean dry gauze. ° °Dressing:  You may change your dressing as needed. Change the dressing with sterile gauze dressing.   ° °Activity:  Increase activity slowly as tolerated, but follow the weight bearing instructions below.  ° °Weight Bearing:   Weight bearing as tolerated to left lower extremity ° °To prevent constipation: you may use a stool softener such as - ° °Colace (over the counter) 100 mg by mouth twice a day  °Drink plenty of fluids (prune juice may be helpful) and high fiber foods °Miralax (over the counter) for constipation as needed.   ° °Itching:  If you experience itching with your medications, try taking only a single pain pill, or even half a pain pill at a time.  You may take up to 10 pain pills per day, and you can also use benadryl over the counter for itching or also to help with sleep.  ° °Precautions:  If you experience chest pain or shortness of breath - call 911 immediately for transfer to the hospital emergency department!! ° °If you develop a fever greater that 101 F, purulent drainage from wound, increased redness or drainage from wound, or calf pain-Call [facility]                                              °Follow- Up Appointment:  Please call for an appointment to be seen in 2 weeks at [facility] ° °

## 2019-10-16 NOTE — Chronic Care Management (AMB) (Signed)
Chronic Care Management    Clinical Social Work Follow Up Note  10/16/2019 Name: Kathy Mata MRN: [id number] DOB: Mar 03, 1924  Kathy Mata is a 86 y.o. year old female who is a primary care patient of Kathy Cords, DO. The CCM team was consulted for assistance with Level of Care Concerns.   Review of patient status, including review of consultants reports, other relevant assessments, and collaboration with appropriate care team members and the patient's provider was performed as part of comprehensive patient evaluation and provision of chronic care management services.    SDOH (Social Determinants of Health) assessments performed: Yes    Facility-Administered Encounter Medications as of 10/16/2019  Medication  . 0.9 %  sodium chloride infusion  . 0.9 %  sodium chloride infusion  . acetaminophen (TYLENOL) tablet 325-650 mg  . alum & mag hydroxide-simeth (MAALOX/MYLANTA) 200-200-20 MG/5ML suspension 30 mL  . bisacodyl (DULCOLAX) suppository 10 mg  . docusate sodium (COLACE) capsule 100 mg  . donepezil (ARICEPT) tablet 10 mg  . enoxaparin (LOVENOX) injection 30 mg  . feeding supplement (ENSURE ENLIVE) (ENSURE ENLIVE) liquid 237 mL  . ferrous fumarate-b12-vitamic C-folic acid (TRINSICON / FOLTRIN) capsule 1 capsule  . flumazenil (ROMAZICON) injection 0.5 mg  . HYDROcodone-acetaminophen (NORCO/VICODIN) 5-325 MG per tablet 1-2 tablet  . influenza vaccine adjuvanted (FLUAD) injection 0.5 mL  . lactated ringers infusion  . levothyroxine (SYNTHROID) tablet 75 mcg  . LORazepam (ATIVAN) injection 1 mg  . magnesium citrate solution 1 Bottle  . magnesium hydroxide (MILK OF MAGNESIA) suspension 30 mL  . menthol-cetylpyridinium (CEPACOL) lozenge 3 mg   Or  . phenol (CHLORASEPTIC) mouth spray 1 spray  . methocarbamol (ROBAXIN) tablet 500 mg   Or  . methocarbamol (ROBAXIN) 500 mg in dextrose 5 % 50 mL IVPB  . metoCLOPramide (REGLAN) tablet 5-10 mg   Or  . metoCLOPramide (REGLAN)  injection 5-10 mg  . metoprolol succinate (TOPROL-XL) 24 hr tablet 25 mg  . morphine 2 MG/ML injection 0.5-1 mg  . naloxone (NARCAN) injection 0.4 mg  . ondansetron (ZOFRAN) tablet 4 mg   Or  . ondansetron (ZOFRAN) injection 4 mg  . pantoprazole (PROTONIX) EC tablet 40 mg  . traZODone (DESYREL) tablet 50 mg  . zolpidem (AMBIEN) tablet 5 mg   Outpatient Encounter Medications as of 10/16/2019  Medication Sig Note  . donepezil (ARICEPT) 10 MG tablet Take 1 tablet (10 mg total) by mouth at bedtime.   Marland Kitchen escitalopram (LEXAPRO) 10 MG tablet Take 5 mg by mouth every other day. 10/13/2019: DAUGHTER STATES SHE IS WEANING OFF LEXAPRO  . levothyroxine (SYNTHROID) 75 MCG tablet Take 1 tablet (75 mcg total) by mouth daily.   . metoprolol succinate (TOPROL-XL) 25 MG 24 hr tablet Take 1 tablet (25 mg total) by mouth daily.   . traZODone (DESYREL) 50 MG tablet Take 1 tablet (50 mg total) by mouth at bedtime.      Goals Addressed    .  SW- "We want ALF placement for Kathy Mata" (pt-stated)        CARE PLAN ENTRY (see longitudinal plan of care for additional care plan information)  Current Barriers:  . Limited social support . Level of care concerns . Mental Health Concerns  . Social Isolation . Limited access to caregiver . Cognitive Deficits . Memory Deficits  Clinical Social Work Clinical Goal(s):   Marland Kitchen Over the next 120 days, patient/caregiver will work with SW to address concerns related to lack of support/resource connection and need for ALF placement.  LCSW will assist patient/caregiver in gaining additional support/resource connection and community resource education in order to maintain health and mental health appropriately  . Over the next 120 days, family will apply for Special Assistance Medicaid. . Over the next 120 days, patient will demonstrate improved health management independence as evidenced by implementing healthy self-care skills and positive support/resources into her daily routine  to help cope with stressors and improve overall health and well-being  . Over the next 120 days, patient or caregiver will verbalize basic understanding of depression/stress process and self health management plan as evidenced by her participation in development of long term plan of care and institution of self health management strategies  Interventions: . Inter-disciplinary care team collaboration (see longitudinal plan of care) . Patient interviewed and appropriate assessments performed. Patient is in need of ALF placement. LCSW provided Kathy Mata (caregiver) with extensive education on how long term care placement, Medicaid enrollment and their financial requirements and steps to transfer Medicaid if ever needed. CCM RNCM has already hand delivered a list of ALF's within [facility]. Patient ask if LCSW can email and mail all resource information to patient. LCSW will send resources out on 09/12/19. UPDATE- Patient's caregiver reports that Kathy Mata declined patient for placement. Kathy Mata states that she contacted Kathy Mata and was informed that she would need to spend down patient's assets (through ALF placement) before she could qualify for Special Assistance Medicaid. Family is unsure if they wish to do this and want to consider in home support AND ALF placement at this time.  . Provided mental health counseling with regard to anxiety management. Patient continues to express great anxiety during 10 am-3 pm everyday. LCSW provided education on how patient can effectively manage those symptoms throughout the day better.  . Discussed plans with patient for ongoing care management follow up and provided patient with direct contact information for care management team . Advised patient/family to go to DSS to apply for Special Assistance Medicaid. Family wish to talk with their attorney first as patient has a trust that she is unable to touch or use for LTC placement expenses. Family has been unable to reach  their attorney in order to discuss patient's trust. LCSW advised Kathy Mata to contact DSS for Special Assistance Medicaid information regarding patient's trust as this is our of our scope of practice. Caregiver reports that she has been unable to reach a return call back from DSS. LCSW emailed patient Medicaid application for her to complete and drop off at DSS.  Marland Kitchen Family will start to complete tours at local ALF's and will notify PCP office once they have found the facility of their choosing in order to start FL2.  [name] with primary care provider and CCM RNCM re: LTC placement concerns regarding payment . Assisted patient/caregiver with obtaining information about health plan benefits . Provided education and assistance to client regarding Advanced Directives. . Provided education to patient/caregiver regarding level of care options. . Provided education to patient/caregiver about Hospice and/or Palliative Care services . Caregiver was wondering if patient could take Trazodone in the morning or during the day as patient is experiencing the most anxiety from 10 am - 3 pm. Patient is lashing out the most on caregiver during this time as well. Per PCP, Regarding the medication change request. I would not change the current medication again, we just changed it yesterday. We are tapering off the Escitalopram, and switching to the Trazodone. The idea with the trazodone was to promote better sleep  and improve the mood, it should provide some benefit throughout the day, not just only at bedtime only. I would advise against adding a PRN medicine for agitation/anxiety at this time - this can be complicated on when to use those medicines and most of them are not without risk. For right now, I would give the new rx Trazodone a chance, it will take several days to weeks to have full benefit. In the future, we may consider using a 2nd dose of Trazodone as a PRN in afternoon for agitation but I would not recommend  starting that yet since we are just starting this medicine now. I think at some point a symptom management from a Palliative or other input may be beneficial, but medicating for anxiety is not always the easiest or preferred option. Family was updated and is agreeable ot this plan.  . Family has started looking into memory care/ALF placement but are struggling to decide if they wish to pay out of pocket (spend down assets in order to qualify for Medicaid) or keep patient in the home and hire in home support. C3 referral made per [name]'s request for in home support education. . DNR has been completed by PCP and picked up successfully by daughter.  . Patient remains not agreeable to LTC placement but family has POA and is able to place patient without her consent. Patient prefers to return to her residence in [place], [place]. Ongoing emotional support, care coordination and strategizing provided to caregiver in order to improve patient's health and safety.  Marland Kitchen UPDATE- LCSW received message from CCM RNCM on 10/16/19. Patient had a fall over the weekend which led to a broke femur. Patient is currently at [facility]. Patient was recommened for SNF placement. Patient will receive 21 days of rehab and will then transfer to ALF. LCSW educated her on Special Assistance Medicaid and their enrollment process. LCSW sent caregiver a list of resources by email for her for reference.  Patient Self Care Activities:  . Attends all scheduled provider appointments . Lacks social connections  Please see past updates related to this goal by clicking on the "Past Updates" button in the selected goal       Follow Up Plan: Embedded care coordination team will continue to follow patient progress and assist with care coordination needs post discharge.   Dickie La, BSW, MSW, LCSW [facility]  [email or web address] Phone: [phone]

## 2019-10-16 NOTE — Progress Notes (Signed)
Physical Therapy Treatment Patient Details Name: Kathy Mata MRN: [id number] DOB: 05/21/1924 Today's Date: 10/16/2019    History of Present Illness Pateint is s/p INTRAMEDULLARY (IM) NAIL INTERTROCHANTRIC (Left) with I&D of open fracture by Dr Rosita Kea    PT Comments    Pt had returned to bed with nursing staff prior to lunch.  Returned after lunch and session focused on supine ex's.  She is able to assist and seems generally comfortable with exercises.    Follow Up Recommendations  SNF     Equipment Recommendations  Rolling walker with 5" wheels    Recommendations for Other Services       Precautions / Restrictions Precautions Precautions: Fall Restrictions Weight Bearing Restrictions: Yes LLE Weight Bearing: Weight bearing as tolerated Other Position/Activity Restrictions: WBAT LLE    Mobility  Bed Mobility Overal bed mobility: Needs Assistance Bed Mobility: Supine to Sit     Supine to sit: Min assist;Mod assist     General bed mobility comments: deferred as she had returned to bed with nursing prior to lunch  Transfers Overall transfer level: Needs assistance Equipment used: Rolling walker (2 wheeled) Transfers: Sit to/from Stand Sit to Stand: Min assist;Mod assist         General transfer comment: Min to Mod A for transfers and sit<> stand - hand placement  Ambulation/Gait Ambulation/Gait assistance: Min assist;Mod assist Gait Distance (Feet): 3 Feet Assistive device: Rolling walker (2 wheeled) Gait Pattern/deviations: Step-to pattern Gait velocity: decreased       Stairs             Wheelchair Mobility    Modified Rankin (Stroke Patients Only)       Balance Overall balance assessment: Needs assistance Sitting-balance support: No upper extremity supported Sitting balance-Leahy Scale: Fair     Standing balance support: Bilateral upper extremity supported Standing balance-Leahy Scale: Poor                               Cognition Arousal/Alertness: Awake/alert Behavior During Therapy: WFL for tasks assessed/performed Overall Cognitive Status: Within Functional Limits for tasks assessed                                 General Comments: repeats questions often during session - about 5 times each      Exercises Other Exercises Other Exercises: LLE 3 x 10 for AARom - anklep umps, heel slides, ab/add and slr    General Comments        Pertinent Vitals/Pain Pain Assessment: Faces Faces Pain Scale: Hurts a little bit Pain Location: with ec but comfortable at rest Pain Descriptors / Indicators: Sore;Operative site guarding Pain Intervention(s): Limited activity within patient's tolerance;Repositioned;Monitored during session    Home Living                      Prior Function            PT Goals (current goals can now be found in the care plan section) Progress towards PT goals: Progressing toward goals    Frequency    BID      PT Plan Current plan remains appropriate    Co-evaluation              AM-PAC PT "6 Clicks" Mobility   Outcome Measure  Help needed turning from your back to your side while in  a flat bed without using bedrails?: A Little Help needed moving from lying on your back to sitting on the side of a flat bed without using bedrails?: A Lot Help needed moving to and from a bed to a chair (including a wheelchair)?: A Lot Help needed standing up from a chair using your arms (e.g., wheelchair or bedside chair)?: A Lot Help needed to walk in hospital room?: A Lot Help needed climbing 3-5 steps with a railing? : Total 6 Click Score: 10    End of Session Equipment Utilized During Treatment: Gait belt Activity Tolerance: Patient tolerated treatment well Patient left: in bed;with call bell/phone within reach;with bed alarm set Nurse Communication: Mobility status       Time: 1311-1320 PT Time Calculation (min) (ACUTE ONLY): 9  min  Charges:  $Therapeutic Exercise: 8-22 mins $Therapeutic Activity: 8-22 mins                    Danielle Dess, PTA 10/16/19, 1:39 PM

## 2019-10-16 NOTE — Chronic Care Management (AMB) (Signed)
  Chronic Care Management   Note  10/16/2019 Name: Kathy Mata MRN: [id number] DOB: 1924/03/31  Incoming text messaging over the weekend from the daughter in law, Blanca Friend, notifying the CCM team of the patient having a fall resulting in injury to left hip with fracture. The patient has had surgery. The daughter in law was reaching out to the CCM team for help and advice on rehabilitation centers for the patient to be admitted to after hospitalization as the patient could not return home.  A secure text message sent to the DIL, [name] and told her the hospital SW and CM would coordinate this care for her while the patient was in the hospital. [name] replied with a text verbalizing understanding.   CMM team and pcp notified of the message sent to the [facility].  Will continue to monitor for changes and needs that may arise.     Alto Denver RN, MSN, CCM Community Care Coordinator [place]  [facility] [place] Mobile: [phone]

## 2019-10-16 NOTE — Progress Notes (Signed)
Physical Therapy Treatment Patient Details Name: Kathy Mata MRN: [id number] DOB: Nov 08, 1924 Today's Date: 10/16/2019    History of Present Illness Pateint is s/p INTRAMEDULLARY (IM) NAIL INTERTROCHANTRIC (Left) with I&D of open fracture by Dr Rosita Kea    PT Comments    Pt in bed, stating she needs to void.  Assisted to EOB with min/mod a x 1.  Stood with walker and takes several small unsteady steps to commode with some buckling noted LLE.  After voiding she transfers to recliner with similar assist and set up for breakfast.  Unable to progress gait further due to gait quality.   Follow Up Recommendations  SNF     Equipment Recommendations  Rolling walker with 5" wheels    Recommendations for Other Services       Precautions / Restrictions Precautions Precautions: Fall Restrictions Weight Bearing Restrictions: Yes LLE Weight Bearing: Weight bearing as tolerated Other Position/Activity Restrictions: WBAT LLE    Mobility  Bed Mobility Overal bed mobility: Needs Assistance Bed Mobility: Supine to Sit     Supine to sit: Min assist;Mod assist        Transfers Overall transfer level: Needs assistance Equipment used: Rolling walker (2 wheeled) Transfers: Sit to/from Stand Sit to Stand: Min assist;Mod assist         General transfer comment: Min to Mod A for transfers and sit<> stand - hand placement  Ambulation/Gait Ambulation/Gait assistance: Min assist;Mod assist Gait Distance (Feet): 3 Feet Assistive device: Rolling walker (2 wheeled) Gait Pattern/deviations: Step-to pattern Gait velocity: decreased       Stairs             Wheelchair Mobility    Modified Rankin (Stroke Patients Only)       Balance Overall balance assessment: Needs assistance Sitting-balance support: No upper extremity supported Sitting balance-Leahy Scale: Fair     Standing balance support: Bilateral upper extremity supported Standing balance-Leahy Scale: Poor                               Cognition Arousal/Alertness: Awake/alert Behavior During Therapy: WFL for tasks assessed/performed Overall Cognitive Status: Within Functional Limits for tasks assessed                                        Exercises Other Exercises Other Exercises: to commode to void    General Comments        Pertinent Vitals/Pain Pain Assessment: Faces Faces Pain Scale: Hurts even more Pain Descriptors / Indicators: Sore;Operative site guarding Pain Intervention(s): Limited activity within patient's tolerance;Monitored during session;Repositioned    Home Living                      Prior Function            PT Goals (current goals can now be found in the care plan section) Progress towards PT goals: Progressing toward goals    Frequency    BID      PT Plan Current plan remains appropriate    Co-evaluation              AM-PAC PT "6 Clicks" Mobility   Outcome Measure  Help needed turning from your back to your side while in a flat bed without using bedrails?: A Little Help needed moving from lying on your back to sitting  on the side of a flat bed without using bedrails?: A Lot Help needed moving to and from a bed to a chair (including a wheelchair)?: A Lot Help needed standing up from a chair using your arms (e.g., wheelchair or bedside chair)?: A Lot Help needed to walk in hospital room?: A Lot Help needed climbing 3-5 steps with a railing? : Total 6 Click Score: 12    End of Session Equipment Utilized During Treatment: Gait belt Activity Tolerance: Patient tolerated treatment well Patient left: in chair;with call bell/phone within reach;with chair alarm set;with family/visitor present Nurse Communication: Mobility status       Time: 6770-3403 PT Time Calculation (min) (ACUTE ONLY): 16 min  Charges:  $Therapeutic Activity: 8-22 mins                    Danielle Dess, PTA 10/16/19, 9:58 AM '

## 2019-10-16 NOTE — Patient Instructions (Signed)
Visit Information  Goals Addressed   None     Patient verbalizes understanding of instructions provided today.   The care management team will reach out to the patient again over the next 30 to 60 days.   Alto Denver RN, MSN, CCM Community Care Coordinator [place]   [facility] [place] Mobile: [phone]

## 2019-10-16 NOTE — Progress Notes (Signed)
   Subjective: 3 Days Post-Op Procedure(s) (LRB): INTRAMEDULLARY (IM) NAIL INTERTROCHANTRIC (Left) Patient reports pain as mild.   Patient is well, and has had no acute complaints or problems  Denies any CP, SOB, ABD pain. We will continue with physical therapy today.   Objective: Vital signs in last 24 hours: Temp:  [98.3 F (36.8 C)-98.7 F (37.1 C)] 98.3 F (36.8 C) (09/27 0756) Pulse Rate:  [76-84] 79 (09/27 0756) Resp:  [16-18] 16 (09/27 0756) BP: (120-140)/(47-70) 140/53 (09/27 0756) SpO2:  [97 %-99 %] 97 % (09/27 0756)  Intake/Output from previous day: No intake/output data recorded. Intake/Output this shift: No intake/output data recorded.  Recent Labs    10/13/19 0859 10/14/19 0626 10/15/19 0515 10/16/19 0527  HGB 9.4* 6.2* 8.9* 8.2*   Recent Labs    10/15/19 0515 10/16/19 0527  WBC 13.8* 13.8*  RBC 2.81* 2.61*  HCT 25.5* 23.7*  PLT 89* 101*   Recent Labs    10/13/19 0859 10/14/19 0626  NA 138 133*  K 3.6 3.8  CL 106 104  CO2 25 25  BUN 20 15  CREATININE 0.77 0.70  GLUCOSE 134* 110*  CALCIUM 8.2* 7.5*   No results for input(s): LABPT, INR in the last 72 hours.  EXAM General - Patient is Alert, Appropriate and Confused Extremity - Neurovascular intact Sensation intact distally Intact pulses distally Dorsiflexion/Plantar flexion intact No cellulitis present Compartment soft Dressing - dressing C/D/I and no drainage, Praveena intact without drainage Motor Function - intact, moving foot and toes well on exam.   Past Medical History:  Diagnosis Date  . Hypertension   . Memory deficit     Assessment/Plan:   3 Days Post-Op Procedure(s) (LRB): INTRAMEDULLARY (IM) NAIL INTERTROCHANTRIC (Left) Active Problems:   Open femur fracture, left (HCC)  Estimated body mass index is 15.26 kg/m as calculated from the following:   Height as of this encounter: 5\' 5"  (1.651 m).   Weight as of this encounter: 41.6 kg. Advance diet Up with therapy   Vital signs are stable Acute post op blood loss anemia -hemoglobin 8.2.  Status post 1 unit packed red blood cells on 10/14/2019. Continue with Iron supplements Recheck hemoglobin in the morning Pain well controlled. Work on bowel movement Care management to assist with discharge to skilled nursing facility  DVT Prophylaxis - Lovenox, TED hose and SCDs Weight-Bearing as tolerated to left leg   T. 10/16/2019, PA-C [facility] 10/16/2019, 8:15 AM

## 2019-10-16 NOTE — Care Management Important Message (Signed)
Important Message  Patient Details  Name: Kathy Mata MRN: [id number] Date of Birth: December 25, 1924   Medicare Important Message Given:  Yes     Johnell Comings 10/16/2019, 11:21 AM

## 2019-10-16 NOTE — Plan of Care (Signed)
  Problem: Education: Goal: Knowledge of General Education information will improve Description: Including pain rating scale, medication(s)/side effects and non-pharmacologic comfort measures Outcome: Progressing   Problem: Health Behavior/Discharge Planning: Goal: Ability to manage health-related needs will improve Outcome: Progressing   Problem: Clinical Measurements: Goal: Ability to maintain clinical measurements within normal limits will improve Outcome: Progressing Goal: Will remain free from infection Outcome: Progressing Goal: Diagnostic test results will improve Outcome: Progressing Goal: Respiratory complications will improve Outcome: Progressing Goal: Cardiovascular complication will be avoided Outcome: Progressing   Problem: Activity: Goal: Risk for activity intolerance will decrease Outcome: Progressing   Problem: Nutrition: Goal: Adequate nutrition will be maintained Outcome: Progressing   Problem: Coping: Goal: Level of anxiety will decrease Outcome: Progressing   Problem: Elimination: Goal: Will not experience complications related to bowel motility Outcome: Progressing Goal: Will not experience complications related to urinary retention Outcome: Progressing   Problem: Pain Managment: Goal: General experience of comfort will improve Outcome: Progressing   Problem: Safety: Goal: Ability to remain free from injury will improve Outcome: Progressing   Problem: Skin Integrity: Goal: Risk for impaired skin integrity will decrease Outcome: Progressing   Problem: Education: Goal: Verbalization of understanding the information provided (i.e., activity precautions, restrictions, etc) will improve Outcome: Progressing Goal: Individualized Educational Video(s) Outcome: Progressing   Problem: Activity: Goal: Ability to ambulate and perform ADLs will improve Outcome: Progressing   Problem: Clinical Measurements: Goal: Postoperative complications will be  avoided or minimized Outcome: Progressing   Problem: Self-Concept: Goal: Ability to maintain and perform role responsibilities to the fullest extent possible will improve Outcome: Progressing   Problem: Pain Management: Goal: Pain level will decrease Outcome: Progressing   Problem: Education: Goal: Knowledge of the prescribed therapeutic regimen will improve Outcome: Progressing Goal: Understanding of discharge needs will improve Outcome: Progressing Goal: Individualized Educational Video(s) Outcome: Progressing   Problem: Activity: Goal: Ability to avoid complications of mobility impairment will improve Outcome: Progressing Goal: Ability to tolerate increased activity will improve Outcome: Progressing   Problem: Clinical Measurements: Goal: Postoperative complications will be avoided or minimized Outcome: Progressing   Problem: Pain Management: Goal: Pain level will decrease with appropriate interventions Outcome: Progressing   Problem: Skin Integrity: Goal: Will show signs of wound healing Outcome: Progressing   

## 2019-10-17 ENCOUNTER — Ambulatory Visit: Payer: Self-pay | Admitting: Licensed Clinical Social Worker

## 2019-10-17 DIAGNOSIS — F01518 Vascular dementia, unspecified severity, with other behavioral disturbance: Secondary | ICD-10-CM

## 2019-10-17 DIAGNOSIS — E039 Hypothyroidism, unspecified: Secondary | ICD-10-CM | POA: Diagnosis not present

## 2019-10-17 DIAGNOSIS — F0151 Vascular dementia with behavioral disturbance: Secondary | ICD-10-CM | POA: Diagnosis not present

## 2019-10-17 DIAGNOSIS — I10 Essential (primary) hypertension: Secondary | ICD-10-CM | POA: Diagnosis not present

## 2019-10-17 DIAGNOSIS — F0390 Unspecified dementia without behavioral disturbance: Secondary | ICD-10-CM | POA: Diagnosis not present

## 2019-10-17 DIAGNOSIS — I739 Peripheral vascular disease, unspecified: Secondary | ICD-10-CM

## 2019-10-17 MED ORDER — BLISTEX MEDICATED EX OINT
TOPICAL_OINTMENT | CUTANEOUS | Status: DC | PRN
  Filled 2019-10-17: qty 6.3

## 2019-10-17 MED ORDER — BISACODYL 10 MG RE SUPP
10.0000 mg | Freq: Once | RECTAL | Status: AC
  Administered 2019-10-17: 10 mg via RECTAL
  Filled 2019-10-17: qty 1

## 2019-10-17 NOTE — Chronic Care Management (AMB) (Signed)
Chronic Care Management    Clinical Social Work Follow Up Note  10/17/2019 Name: Kathy Mata MRN: [id number] DOB: 11-May-1924  Kathy Mata is a 86 y.o. year old female who is a primary care patient of [name], DO. The CCM team was consulted for assistance with Level of Care Concerns.   Review of patient status, including review of consultants reports, other relevant assessments, and collaboration with appropriate care team members and the patient's provider was performed as part of comprehensive patient evaluation and provision of chronic care management services.    SDOH (Social Determinants of Health) assessments performed: Yes    Facility-Administered Encounter Medications as of 10/17/2019  Medication  . 0.9 %  sodium chloride infusion  . 0.9 %  sodium chloride infusion  . acetaminophen (TYLENOL) tablet 325-650 mg  . alum & mag hydroxide-simeth (MAALOX/MYLANTA) 200-200-20 MG/5ML suspension 30 mL  . bisacodyl (DULCOLAX) suppository 10 mg  . docusate sodium (COLACE) capsule 100 mg  . donepezil (ARICEPT) tablet 10 mg  . enoxaparin (LOVENOX) injection 30 mg  . feeding supplement (ENSURE ENLIVE) (ENSURE ENLIVE) liquid 237 mL  . ferrous fumarate-b12-vitamic C-folic acid (TRINSICON / FOLTRIN) capsule 1 capsule  . flumazenil (ROMAZICON) injection 0.5 mg  . HYDROcodone-acetaminophen (NORCO/VICODIN) 5-325 MG per tablet 1-2 tablet  . influenza vaccine adjuvanted (FLUAD) injection 0.5 mL  . lactated ringers infusion  . levothyroxine (SYNTHROID) tablet 75 mcg  . LORazepam (ATIVAN) injection 1 mg  . magnesium citrate solution 1 Bottle  . magnesium hydroxide (MILK OF MAGNESIA) suspension 30 mL  . menthol-cetylpyridinium (CEPACOL) lozenge 3 mg   Or  . phenol (CHLORASEPTIC) mouth spray 1 spray  . methocarbamol (ROBAXIN) tablet 500 mg   Or  . methocarbamol (ROBAXIN) 500 mg in dextrose 5 % 50 mL IVPB  . metoCLOPramide (REGLAN) tablet 5-10 mg   Or  . metoCLOPramide (REGLAN)  injection 5-10 mg  . metoprolol succinate (TOPROL-XL) 24 hr tablet 25 mg  . morphine 2 MG/ML injection 0.5-1 mg  . naloxone (NARCAN) injection 0.4 mg  . ondansetron (ZOFRAN) tablet 4 mg   Or  . ondansetron (ZOFRAN) injection 4 mg  . pantoprazole (PROTONIX) EC tablet 40 mg  . traZODone (DESYREL) tablet 50 mg  . zolpidem (AMBIEN) tablet 5 mg   Outpatient Encounter Medications as of 10/17/2019  Medication Sig Note  . donepezil (ARICEPT) 10 MG tablet Take 1 tablet (10 mg total) by mouth at bedtime.   . enoxaparin (LOVENOX) 30 MG/0.3ML injection Inject 0.3 mLs (30 mg total) into the skin daily.   Marland Kitchen escitalopram (LEXAPRO) 10 MG tablet Take 5 mg by mouth every other day. 10/13/2019: DAUGHTER STATES SHE IS WEANING OFF LEXAPRO  . HYDROcodone-acetaminophen (NORCO/VICODIN) 5-325 MG tablet Take 1-2 tablets by mouth every 4 (four) hours as needed for moderate pain (pain score 4-6).   Marland Kitchen levothyroxine (SYNTHROID) 75 MCG tablet Take 1 tablet (75 mcg total) by mouth daily.   . metoprolol succinate (TOPROL-XL) 25 MG 24 hr tablet Take 1 tablet (25 mg total) by mouth daily.   . traZODone (DESYREL) 50 MG tablet Take 1 tablet (50 mg total) by mouth at bedtime.      Goals Addressed    .  SW- "We want ALF placement for [name]" (pt-stated)        CARE PLAN ENTRY (see longitudinal plan of care for additional care plan information)  Current Barriers:  . Limited social support . Level of care concerns . Mental Health Concerns  . Social Isolation .  Limited access to caregiver . Cognitive Deficits . Memory Deficits  Clinical Social Work Clinical Goal(s):   Marland Kitchen Over the next 120 days, patient/caregiver will work with SW to address concerns related to lack of support/resource connection and need for ALF placement. LCSW will assist patient/caregiver in gaining additional support/resource connection and community resource education in order to maintain health and mental health appropriately  . Over the next 120  days, family will apply for Special Assistance Medicaid. . Over the next 120 days, patient will demonstrate improved health management independence as evidenced by implementing healthy self-care skills and positive support/resources into her daily routine to help cope with stressors and improve overall health and well-being  . Over the next 120 days, patient or caregiver will verbalize basic understanding of depression/stress process and self health management plan as evidenced by her participation in development of long term plan of care and institution of self health management strategies  Interventions: . Inter-disciplinary care team collaboration (see longitudinal plan of care) . Patient interviewed and appropriate assessments performed. Patient is in need of ALF placement. LCSW provided [name] (caregiver) with extensive education on how long term care placement, Medicaid enrollment and their financial requirements and steps to transfer Medicaid if ever needed. CCM RNCM has already hand delivered a list of ALF's within [facility]. Patient ask if LCSW can email and mail all resource information to patient. LCSW will send resources out on 09/12/19. UPDATE- Patient's caregiver reports that [name] declined patient for placement. [name] states that she contacted [name] and was informed that she would need to spend down patient's assets (through ALF placement) before she could qualify for Special Assistance Medicaid. Family is unsure if they wish to do this and want to consider in home support AND ALF placement at this time.  . Provided mental health counseling with regard to anxiety management. Patient continues to express great anxiety during 10 am-3 pm everyday. LCSW provided education on how patient can effectively manage those symptoms throughout the day better.  . Discussed plans with patient for ongoing care management follow up and provided patient with direct contact information for care  management team . Advised patient/family to go to DSS to apply for Special Assistance Medicaid. Family wish to talk with their attorney first as patient has a trust that she is unable to touch or use for LTC placement expenses. Family has been unable to reach their attorney in order to discuss patient's trust. LCSW advised [name] to contact DSS for Special Assistance Medicaid information regarding patient's trust as this is our of our scope of practice. Caregiver reports that she has been unable to reach a return call back from DSS. LCSW emailed patient Medicaid application for her to complete and drop off at DSS.  Marland Kitchen Family will start to complete tours at local ALF's and will notify PCP office once they have found the facility of their choosing in order to start FL2.  [name] with primary care provider and CCM RNCM re: LTC placement concerns regarding payment . Assisted patient/caregiver with obtaining information about health plan benefits . Provided education and assistance to client regarding Advanced Directives. . Provided education to patient/caregiver regarding level of care options. . Provided education to patient/caregiver about Hospice and/or Palliative Care services . Caregiver was wondering if patient could take Trazodone in the morning or during the day as patient is experiencing the most anxiety from 10 am - 3 pm. Patient is lashing out the most on caregiver during this time  as well. Per PCP, Regarding the medication change request. I would not change the current medication again, we just changed it yesterday. We are tapering off the Escitalopram, and switching to the Trazodone. The idea with the trazodone was to promote better sleep and improve the mood, it should provide some benefit throughout the day, not just only at bedtime only. I would advise against adding a PRN medicine for agitation/anxiety at this time - this can be complicated on when to use those medicines and most of them are  not without risk. For right now, I would give the new rx Trazodone a chance, it will take several days to weeks to have full benefit. In the future, we may consider using a 2nd dose of Trazodone as a PRN in afternoon for agitation but I would not recommend starting that yet since we are just starting this medicine now. I think at some point a symptom management from a Palliative or other input may be beneficial, but medicating for anxiety is not always the easiest or preferred option. Family was updated and is agreeable ot this plan.  . Family has started looking into memory care/ALF placement but are struggling to decide if they wish to pay out of pocket (spend down assets in order to qualify for Medicaid) or keep patient in the home and hire in home support. C3 referral made per [name]'s request for in home support education. . DNR has been completed by PCP and picked up successfully by daughter.  . Patient remains not agreeable to LTC placement but family has POA and is able to place patient without her consent. Patient prefers to return to her residence in [place], [place]. Ongoing emotional support, care coordination and strategizing provided to caregiver in order to improve patient's health and safety.  Marland Kitchen UPDATE- 10/16/19-LCSW received message from CCM RNCM on 10/16/19. Patient had a fall over the weekend which led to a broke femur. Patient is currently at [facility]. Patient was recommened for SNF placement. Patient will receive 21 days of rehab and will then transfer to ALF. LCSW educated her on Special Assistance Medicaid and their enrollment process. LCSW sent caregiver a list of resources by email for her for reference.  Marland Kitchen UPDATE-10/16/19- LCSW received incoming call from caregiver/ Blanca Friend today. She reports that patient has been approved for Skilled Nursing for 21 days at [facility]. [facility] has ALF, Skilled Nursing AND Memory care and family would like for patient to transition from SNF to Memory  Care LTC once she has completed 21 days of therapy. LCSW encouraged family to start Medicaid application ASAP and to contact the admissions department and social worker at [facility] to discuss LTC planning. Family agreeable. LCSW will update CCM team and PCP.   Patient Self Care Activities:  . Attends all scheduled provider appointments . Lacks social connections  Please see past updates related to this goal by clicking on the "Past Updates" button in the selected goal       Follow Up Plan: SW will follow up with patient by phone over the next quarter to assist with Memory Care LTC placement  Dickie La, BSW, MSW, LCSW [facility]  [facility] [email or web address] Phone: [phone]

## 2019-10-17 NOTE — TOC Progression Note (Signed)
Transition of Care [facility]) - Progression Note    Patient Details  Name: Kathy Mata MRN: [id number] Date of Birth: 1924/11/08  [facility]) CM/SW [place], RN Phone Number: 10/17/2019, 9:09 AM  Clinical Narrative:   RNCM met with patient and DIL [name] in room to present bed offers. Discussed that at this time due to patient's insurance and their requirements for placement the only bed offer we have is [facility]. Spent good deal of time with DIL discussing future discharge plans including area assisted living facilities and how to reach out to get information on these.  RNCM accepted facility in the hub and notified Gerald Stabs. Insurance authorization started. Patient will need rapid Covid prior to d/c    Expected Discharge Plan: [place] Barriers to Discharge: Continued Medical Work up  Expected Discharge Plan and Services Expected Discharge Plan: [place] Choice: [place] arrangements for the past 2 months: Single Family Home                                       Social Determinants of Health (SDOH) Interventions    Readmission Risk Interventions No flowsheet data found.

## 2019-10-17 NOTE — Progress Notes (Signed)
OT Cancellation Note  Patient Details Name: Kathy Mata MRN: [id number] DOB: [birth year]   Cancelled Treatment:    Reason Eval/Treat Not Completed: Other (comment). Pt with staff for pt care. Will re-attempt at later time for OT treatment session.  Richrd Prime, MPH, MS, OTR/L ascom [phone] 10/17/19, 9:48 AM

## 2019-10-17 NOTE — Progress Notes (Signed)
   Subjective: 4 Days Post-Op Procedure(s) (LRB): INTRAMEDULLARY (IM) NAIL INTERTROCHANTRIC (Left) Patient reports pain as mild.   Patient is well, and has had no acute complaints or problems  Denies any CP, SOB, ABD pain. We will continue with physical therapy today.   Objective: Vital signs in last 24 hours: Temp:  [98.3 F (36.8 C)-99.1 F (37.3 C)] 99.1 F (37.3 C) (09/28 0000) Pulse Rate:  [71-81] 81 (09/28 0000) Resp:  [16-18] 18 (09/28 0000) BP: (119-140)/(41-53) 129/52 (09/28 0000) SpO2:  [96 %-97 %] 96 % (09/28 0000)  Intake/Output from previous day: 09/27 0701 - 09/28 0700 In: 240 [P.O.:240] Out: 0  Intake/Output this shift: No intake/output data recorded.  Recent Labs    10/15/19 0515 10/16/19 0527  HGB 8.9* 8.2*   Recent Labs    10/15/19 0515 10/16/19 0527  WBC 13.8* 13.8*  RBC 2.81* 2.61*  HCT 25.5* 23.7*  PLT 89* 101*   No results for input(s): NA, K, CL, CO2, BUN, CREATININE, GLUCOSE, CALCIUM in the last 72 hours. No results for input(s): LABPT, INR in the last 72 hours.  EXAM General - Patient is Alert, Appropriate and Confused Extremity - Neurovascular intact Sensation intact distally Intact pulses distally Dorsiflexion/Plantar flexion intact No cellulitis present Compartment soft Dressing - dressing C/D/I and no drainage, Praveena intact without drainage Motor Function - intact, moving foot and toes well on exam.   Past Medical History:  Diagnosis Date  . Hypertension   . Memory deficit     Assessment/Plan:   4 Days Post-Op Procedure(s) (LRB): INTRAMEDULLARY (IM) NAIL INTERTROCHANTRIC (Left) Active Problems:   Open femur fracture, left (HCC)  Estimated body mass index is 15.26 kg/m as calculated from the following:   Height as of this encounter: 5\' 5"  (1.651 m).   Weight as of this encounter: 41.6 kg. Advance diet Up with therapy  Vital signs are stable Acute post op blood loss anemia -hemoglobin pending this am.  Status  post 1 unit packed red blood cells on 10/14/2019. Continue with Iron supplements Pain well controlled. Work on bowel movement, suppository ordered  Care management to assist with discharge to skilled nursing facility  DVT Prophylaxis - Lovenox, TED hose and SCDs Weight-Bearing as tolerated to left leg   T. 10/16/2019, PA-C [facility] 10/17/2019, 6:43 AM

## 2019-10-17 NOTE — Progress Notes (Signed)
Physical Therapy Treatment Patient Details Name: Kathy Mata MRN: [id number] DOB: 02/12/1924 Today's Date: 10/17/2019    History of Present Illness Pateint is s/p INTRAMEDULLARY (IM) NAIL INTERTROCHANTRIC (Left) with I&D of open fracture by Dr Rosita Kea    PT Comments    Pt was Supine in bed upon arriving. She greets therapist but requesting to take a nap. Agrees to OOB with only minimal encouragement. Was able to exit L side of bed with increased time and vcs throughout for sequencing and technique. Stood to [employer] and was able to take steps to (~30ft) recliner. Needs increased time to ambulate. Slow antalgic step to pattern with poor posture. Will benefit from SNF at [place] to address deficits and improve safe functional mobility. Pt was in recliner with chair alarm in place, family member in room, and RN aware of pt's abilities.      Follow Up Recommendations  SNF     Equipment Recommendations  None recommended by PT    Recommendations for Other Services       Precautions / Restrictions Precautions Precautions: Fall Restrictions Weight Bearing Restrictions: Yes LLE Weight Bearing: Weight bearing as tolerated Other Position/Activity Restrictions: WBAT LLE    Mobility  Bed Mobility Overal bed mobility: Needs Assistance Bed Mobility: Supine to Sit     Supine to sit: Min assist;Mod assist;HOB elevated     General bed mobility comments: Increased time to perform. Vcs throughout for technique and sequencing  Transfers Overall transfer level: Needs assistance Equipment used: Rolling walker (2 wheeled) Transfers: Sit to/from Stand Sit to Stand: Min assist;Mod assist;From elevated surface            Ambulation/Gait Ambulation/Gait assistance: Min assist;Mod assist Gait Distance (Feet): 8 Feet Assistive device: Rolling walker (2 wheeled) Gait Pattern/deviations: Step-to pattern;Antalgic;Trunk flexed;Narrow base of support Gait velocity: decreased   General Gait Details: pt  was able to take ~ 8 steps from EOB. vcs for improved posture and gait kinematics      Balance Overall balance assessment: Needs assistance Sitting-balance support: No upper extremity supported Sitting balance-Leahy Scale: Fair Sitting balance - Comments: Sat EOB without LOB but CGA? close SBA throughout   Standing balance support: Bilateral upper extremity supported Standing balance-Leahy Scale: Fair Standing balance comment: reliant on UE support for safety       Cognition Arousal/Alertness: Awake/alert Behavior During Therapy: WFL for tasks assessed/performed Overall Cognitive Status: Within Functional Limits for tasks assessed        General Comments: pt is alert throughout and able to follwo commands however does have baseline cognition deficits             Pertinent Vitals/Pain Pain Assessment: 0-10 Pain Score: 0-No pain Faces Pain Scale: No hurt Pain Location: no C/O pain at rest Pain Descriptors / Indicators: Sore;Operative site guarding Pain Intervention(s): Limited activity within patient's tolerance;Monitored during session;Repositioned           PT Goals (current goals can now be found in the care plan section) Acute Rehab PT Goals Patient Stated Goal: none stated Progress towards PT goals: Progressing toward goals    Frequency    BID      PT Plan Current plan remains appropriate       AM-PAC PT "6 Clicks" Mobility   Outcome Measure  Help needed turning from your back to your side while in a flat bed without using bedrails?: A Little Help needed moving from lying on your back to sitting on the side of a flat bed without  using bedrails?: A Lot Help needed moving to and from a bed to a chair (including a wheelchair)?: A Lot Help needed standing up from a chair using your arms (e.g., wheelchair or bedside chair)?: A Lot Help needed to walk in hospital room?: A Lot Help needed climbing 3-5 steps with a railing? : A Lot 6 Click Score: 13     End of Session Equipment Utilized During Treatment: Gait belt Activity Tolerance: Patient tolerated treatment well Patient left: in chair;with call bell/phone within reach;with chair alarm set;with family/visitor present Nurse Communication: Mobility status PT Visit Diagnosis: Unsteadiness on feet (R26.81);Muscle weakness (generalized) (M62.81);History of falling (Z91.81);Difficulty in walking, not elsewhere classified (R26.2)     Time: 8657-8469 PT Time Calculation (min) (ACUTE ONLY): 18 min  Charges:  $Therapeutic Activity: 8-22 mins                     Jetta Lout PTA 10/17/19, 12:56 PM

## 2019-10-17 NOTE — Progress Notes (Signed)
Physical Therapy Treatment Patient Details Name: Kathy Mata MRN: [id number] DOB: 1925-01-09 Today's Date: 10/17/2019    History of Present Illness Pateint is s/p INTRAMEDULLARY (IM) NAIL INTERTROCHANTRIC (Left) with I&D of open fracture by Dr Rosita Kea    PT Comments    Pt was seated in recliner upon arriving requesting to go to BR. She also request to get back to bed after BM. Unsuccessful BM but did urinate. Required mod assist to stand from lower surface height. Ambulated with RW with slow step to gait pattern. Does have slight knee buckling during gait. Overall progressing slowly. Will need continued skilled PT at [place] to address deficits and improve safe functional mobility.     Follow Up Recommendations  SNF     Equipment Recommendations  None recommended by PT    Recommendations for Other Services       Precautions / Restrictions Precautions Precautions: Fall Restrictions Weight Bearing Restrictions: Yes LLE Weight Bearing: Weight bearing as tolerated Other Position/Activity Restrictions: WBAT LLE    Mobility  Bed Mobility Overal bed mobility: Needs Assistance Bed Mobility: Sit to Supine     Supine to sit: Min assist;Mod assist;HOB elevated Sit to supine: Mod assist;Max assist;HOB elevated   General bed mobility comments: Increased time to perform. attempted to use non op leg to assist op leg intobed however unable.  Transfers Overall transfer level: Needs assistance Equipment used: Rolling walker (2 wheeled) Transfers: Sit to/from Stand Sit to Stand: Mod assist         General transfer comment: mod assist required to stand from lower recliner/BSC height  Ambulation/Gait Ambulation/Gait assistance: Min assist Gait Distance (Feet): 10 Feet Assistive device: Rolling walker (2 wheeled) Gait Pattern/deviations: Step-to pattern;Antalgic;Trunk flexed;Narrow base of support Gait velocity: decreased   General Gait Details: Pt was able to ambulate from [facility] around  EOB ~ 10 ft with slow step to antalgic gait pattern.        Balance Overall balance assessment: Needs assistance Sitting-balance support: No upper extremity supported Sitting balance-Leahy Scale: Fair Sitting balance - Comments: no LOB sitting EOB   Standing balance support: Bilateral upper extremity supported Standing balance-Leahy Scale: Fair Standing balance comment: reliant on UE support for safety         Cognition Arousal/Alertness: Awake/alert Behavior During Therapy: WFL for tasks assessed/performed Overall Cognitive Status: Within Functional Limits for tasks assessed    General Comments: Pt is A sitting in recliner from AM session requesting to return to bed after trying to have BM.      Exercises General Exercises - Lower Extremity Ankle Circles/Pumps: AROM;10 reps;Both Quad Sets: AROM;10 reps Heel Slides: AROM;10 reps Hip ABduction/ADduction: AAROM Straight Leg Raises: AAROM;10 reps        Pertinent Vitals/Pain Pain Assessment: 0-10 Pain Score: 0-No pain Faces Pain Scale: No hurt Pain Location: no c/o pain at rest Pain Descriptors / Indicators: Sore;Operative site guarding Pain Intervention(s): Limited activity within patient's tolerance;Monitored during session;Premedicated before session;Repositioned;Ice applied           PT Goals (current goals can now be found in the care plan section) Acute Rehab PT Goals Patient Stated Goal: none stated Progress towards PT goals: Progressing toward goals    Frequency    BID      PT Plan Current plan remains appropriate       AM-PAC PT "6 Clicks" Mobility   Outcome Measure  Help needed turning from your back to your side while in a flat bed without using bedrails?: A Little  Help needed moving from lying on your back to sitting on the side of a flat bed without using bedrails?: A Lot Help needed moving to and from a bed to a chair (including a wheelchair)?: A Lot Help needed standing up from a chair  using your arms (e.g., wheelchair or bedside chair)?: A Lot Help needed to walk in hospital room?: A Lot Help needed climbing 3-5 steps with a railing? : A Lot 6 Click Score: 13    End of Session Equipment Utilized During Treatment: Gait belt Activity Tolerance: Patient tolerated treatment well Patient left: in bed;with call bell/phone within reach;with bed alarm set;with family/visitor present Nurse Communication: Mobility status PT Visit Diagnosis: Unsteadiness on feet (R26.81);Muscle weakness (generalized) (M62.81);History of falling (Z91.81);Difficulty in walking, not elsewhere classified (R26.2)     Time: 1062-6948 PT Time Calculation (min) (ACUTE ONLY): 28 min  Charges:  $Gait Training: 8-22 mins $Therapeutic Exercise: 8-22 mins $Therapeutic Activity: 8-22 mins                     Jetta Lout PTA 10/17/19, 2:10 PM

## 2019-10-18 ENCOUNTER — Inpatient Hospital Stay: Payer: Medicare HMO

## 2019-10-18 LAB — RESPIRATORY PANEL BY RT PCR (FLU A&B, COVID)
Influenza A by PCR: NEGATIVE
Influenza B by PCR: NEGATIVE
SARS Coronavirus 2 by RT PCR: NEGATIVE

## 2019-10-18 MED ORDER — FE FUMARATE-B12-VIT C-FA-IFC PO CAPS: 1.0000 | ORAL_CAPSULE | Freq: Two times a day (BID) | ORAL | 0 refills | Status: AC

## 2019-10-18 MED ORDER — BISACODYL 10 MG RE SUPP
10.0000 mg | Freq: Once | RECTAL | Status: AC
  Administered 2019-10-19: 10 mg via RECTAL
  Filled 2019-10-18: qty 1

## 2019-10-18 NOTE — Progress Notes (Signed)
Physical Therapy Treatment Patient Details Name: Kathy Mata MRN: [id number] DOB: 02/09/1924 Today's Date: 10/18/2019    History of Present Illness Pateint is s/p INTRAMEDULLARY (IM) NAIL INTERTROCHANTRIC (Left) with I&D of open fracture by Dr Rosita Kea    PT Comments    Pt was awake in semi-fowlers position upon arriving. She greets therapist and is in relatively good spirits throughout. Does have baseline cognition deficits per family. Pt was able to exit L side of bed with assistance. Requires increased time throughout session for performance of desired task but does consistently follow commands throughout. report pain in LLE in wt bearing. Stood to [employer] and was able to take ~ 5 steps to [facility], urinated, and stood and returned to EOB. Pt has very slow, step to, antalgic gait pattern with slight knee buckling. Discussed importance of performing exercises in bed. Ordered pt's breakfast at conclusion of session.Will benefit from continued skilled PT at SNF to address deficits and assist pt with returning to PLOF. Pt was in bed with bed rails raised, call bell in reach, bed alarm in place and SCDs reapplied.     Follow Up Recommendations  SNF     Equipment Recommendations  Other (comment) (defer to next level of care)    Recommendations for Other Services       Precautions / Restrictions Precautions Precautions: Fall Precaution Comments: Per RN tech, pt had unwitness fall previous night.  Restrictions Weight Bearing Restrictions: Yes LLE Weight Bearing: Weight bearing as tolerated    Mobility  Bed Mobility Overal bed mobility: Needs Assistance Bed Mobility: Supine to Sit;Sit to Supine     Supine to sit: HOB elevated;Min assist;Mod assist Sit to supine: Mod assist;HOB elevated   General bed mobility comments: Pt required assistance to exit and re-enter bed.   Transfers Overall transfer level: Needs assistance Equipment used: Rolling walker (2 wheeled) Transfers: Sit to/from  Stand Sit to Stand: Min assist;From elevated surface;Mod assist (mod from lower BSC height)            Ambulation/Gait Ambulation/Gait assistance: Min assist Gait Distance (Feet): 4 Feet Assistive device: Rolling walker (2 wheeled) Gait Pattern/deviations: Step-to pattern;Antalgic;Trunk flexed;Narrow base of support Gait velocity: decreased   General Gait Details: Pt has very slow step to antalgic gait pattern. c/o LLE pain in wt bearing       Balance Overall balance assessment: Needs assistance Sitting-balance support: No upper extremity supported;Feet supported Sitting balance-Leahy Scale: Fair     Standing balance support: Bilateral upper extremity supported Standing balance-Leahy Scale: Poor Standing balance comment: reliant on UE support for safety. High fall risk 2/2 to cognition and lets go of RW at times      Cognition Arousal/Alertness: Awake/alert Behavior During Therapy: [facility] for tasks assessed/performed Overall Cognitive Status: History of cognitive impairments - at baseline    General Comments: Pt has cognitive deficits at baseline.              Pertinent Vitals/Pain Pain Assessment: Faces Faces Pain Scale: Hurts a little bit Pain Location: did c/o minimal R arm/shoulder pain that did not limit session. Pain Descriptors / Indicators: Sore;Operative site guarding Pain Intervention(s): Limited activity within patient's tolerance;Monitored during session;Repositioned           PT Goals (current goals can now be found in the care plan section) Acute Rehab PT Goals Patient Stated Goal: none stated Progress towards PT goals: Progressing toward goals    Frequency    BID      PT Plan Current  plan remains appropriate    Co-evaluation              AM-PAC PT "6 Clicks" Mobility   Outcome Measure  Help needed turning from your back to your side while in a flat bed without using bedrails?: A Little Help needed moving from lying on your back  to sitting on the side of a flat bed without using bedrails?: A Lot Help needed moving to and from a bed to a chair (including a wheelchair)?: A Lot Help needed standing up from a chair using your arms (e.g., wheelchair or bedside chair)?: A Lot Help needed to walk in hospital room?: A Lot Help needed climbing 3-5 steps with a railing? : A Lot 6 Click Score: 13    End of Session Equipment Utilized During Treatment: Gait belt Activity Tolerance: Patient tolerated treatment well Patient left: in bed;with call bell/phone within reach;with bed alarm set;with family/visitor present Nurse Communication: Mobility status PT Visit Diagnosis: Unsteadiness on feet (R26.81);Muscle weakness (generalized) (M62.81);History of falling (Z91.81);Difficulty in walking, not elsewhere classified (R26.2)     Time: 9147-8295 PT Time Calculation (min) (ACUTE ONLY): 30 min  Charges:  $Therapeutic Activity: 23-37 mins                     Jetta Lout PTA 10/18/19, 9:51 AM

## 2019-10-18 NOTE — Progress Notes (Signed)
Patient bed alarm was not checked during safe patient hand off day and night shift RN unaware the bed alarm was turned off. Patient climbed over the railing or scooted to the end of the bed and fell to the floor. fall was unwitnessed. Vitals stable. No new injuries sustained. Dr. Allena Katz notified with no new orders/test. Proper documentation completed.

## 2019-10-18 NOTE — Discharge Summary (Signed)
Physician Discharge Summary  Patient ID: Kathy Mata MRN: [id number] DOB/AGE: 06/28/1924 86 y.o.  Admit date: 10/13/2019 Discharge date: 10/19/2019 Admission Diagnoses:  Fall [W19.XXXA] Fall, initial encounter [id number]] Closed left subtrochanteric femur fracture, initial encounter (HCC) [S72.22XA] Open femur fracture, left (HCC) [S72.92XB]   Discharge Diagnoses: Patient Active Problem List   Diagnosis Date Noted  . Open femur fracture, left (HCC) 10/13/2019  . DNR (do not resuscitate) 09/18/2019  . Depression with anxiety 09/11/2019  . Aortic atherosclerosis (HCC) 07/16/2019  . PAD (peripheral artery disease) (HCC) 07/06/2019  . Bilateral carotid artery stenosis 07/06/2019  . Acquired hypothyroidism 06/22/2019  . Essential hypertension 06/22/2019  . Vascular dementia with behavior disturbance (HCC) 06/22/2019    Past Medical History:  Diagnosis Date  . Hypertension   . Memory deficit      Transfusion: None   Consultants (if any): Treatment Team:  Kennedy Bucker, MD  Discharged Condition: Improved  Hospital Course: Kathy Mata is an 86 y.o. female who was admitted 10/13/2019 with a diagnosis of left hip fracture and went to the operating room on 10/13/2019 and underwent the above named procedures.    Surgeries: Procedure(s): INTRAMEDULLARY (IM) NAIL INTERTROCHANTRIC on 10/13/2019 Patient tolerated the surgery well. Taken to PACU where she was stabilized and then transferred to the orthopedic floor.  Started on Lovenox 30 mg q 24 hrs. Foot pumps applied bilaterally at 80 mm. Heels elevated on bed with rolled towels. No evidence of DVT. Negative Homan. Physical therapy started on day #1 for gait training and transfer. OT started day #1 for ADL and assisted devices.  During hospital stay, patient made slow progress of physical therapy.  Pain seemed to be well controlled.  Vital signs stable.  Labs stable.  Patient's foley was d/c on day #1. Patient's IV  was d/c on day  #2.  On post op day #6 patient was stable and [facility] Prior to discharge patient was given flu vaccine.    Please remove provena negative pressure dressing on 10/26/2019 and apply honey comb dressing. Keep dressing clean and dry at all times.  TED hose bilateral lower extremity x6 weeks  Lovenox 30 mg subcu daily x14 days  Follow-up with [facility] orthopedics in 2 weeks per staple removal and Steri-Strip application   Implants:  Biomet affixes 9 x 360 rod with 100 mm leg screw and 44 mm distal interlocking screw  She was given perioperative antibiotics:  Anti-infectives (From admission, onward)   Start     Dose/Rate Route Frequency Ordered Stop   10/13/19 2200  ceFAZolin (ANCEF) IVPB 1 g/50 mL premix        1 g 100 mL/hr over 30 Minutes Intravenous Every 8 hours 10/13/19 1913 10/15/19 1552   10/13/19 1100  ceFAZolin (ANCEF) powder 1 g  Status:  Discontinued        1 g Other To Surgery 10/13/19 0818 10/13/19 0833   10/13/19 0845  ceFAZolin (ANCEF) IVPB 1 g/50 mL premix        1 g 100 mL/hr over 30 Minutes Intravenous  Once 10/13/19 0833 10/13/19 1342    .  She was given sequential compression devices, early ambulation, and Lovenox, teds for DVT prophylaxis.  She benefited maximally from the hospital stay and there were no complications.    Recent vital signs:  Vitals:   10/18/19 0559 10/18/19 0727  BP: (!) 148/58 (!) 140/56  Pulse: 68 75  Resp: 16 17  Temp: 98.4 F (36.9 C) 98.2 F (  36.8 C)  SpO2: 96% 95%    Recent laboratory studies:  Lab Results  Component Value Date   HGB 8.2 (L) 10/16/2019   HGB 8.9 (L) 10/15/2019   HGB 6.2 (L) 10/14/2019   Lab Results  Component Value Date   WBC 13.8 (H) 10/16/2019   PLT 101 (L) 10/16/2019   No results found for: INR Lab Results  Component Value Date   NA 133 (L) 10/14/2019   K 3.8 10/14/2019   CL 104 10/14/2019   CO2 25 10/14/2019   BUN 15 10/14/2019   CREATININE 0.70 10/14/2019   GLUCOSE 110 (H)  10/14/2019    Discharge Medications:   Allergies as of 10/18/2019   No Known Allergies     Medication List    TAKE these medications   donepezil 10 MG tablet Commonly known as: ARICEPT Take 1 tablet (10 mg total) by mouth at bedtime.   enoxaparin 30 MG/0.3ML injection Commonly known as: LOVENOX Inject 0.3 mLs (30 mg total) into the skin daily.   escitalopram 10 MG tablet Commonly known as: LEXAPRO Take 5 mg by mouth every other day.   ferrous fumarate-b12-vitamic C-folic acid capsule Commonly known as: TRINSICON / FOLTRIN Take 1 capsule by mouth 2 (two) times daily after a meal.   HYDROcodone-acetaminophen 5-325 MG tablet Commonly known as: NORCO/VICODIN Take 1-2 tablets by mouth every 4 (four) hours as needed for moderate pain (pain score 4-6).   levothyroxine 75 MCG tablet Commonly known as: SYNTHROID Take 1 tablet (75 mcg total) by mouth daily.   metoprolol succinate 25 MG 24 hr tablet Commonly known as: TOPROL-XL Take 1 tablet (25 mg total) by mouth daily.   traZODone 50 MG tablet Commonly known as: DESYREL Take 1 tablet (50 mg total) by mouth at bedtime.       Diagnostic Studies: DG Chest 1 View  Result Date: 10/13/2019 CLINICAL DATA:  Fall today. EXAM: CHEST  1 VIEW COMPARISON:  None. FINDINGS: The heart size and mediastinal contours are within normal limits. Both lungs are clear. The visualized skeletal structures are unremarkable. IMPRESSION: No active disease. Electronically Signed   By: Lupita Raider M.D.   On: 10/13/2019 08:38   DG Pelvis 1-2 Views  Result Date: 10/13/2019 CLINICAL DATA:  Left hip deformity after fall. EXAM: PELVIS - 1-2 VIEW COMPARISON:  None. FINDINGS: Severely displaced and comminuted fracture is seen involving the intertrochanteric region of the proximal left femur. IMPRESSION: Severely displaced and comminuted intertrochanteric fracture of proximal left femur. Electronically Signed   By: Lupita Raider M.D.   On: 10/13/2019 08:36    CT Head Wo Contrast  Result Date: 10/13/2019 CLINICAL DATA:  Head injury after fall today. EXAM: CT HEAD WITHOUT CONTRAST TECHNIQUE: Contiguous axial images were obtained from the base of the skull through the vertex without intravenous contrast. COMPARISON:  None. FINDINGS: Brain: No evidence of acute infarction, hemorrhage, hydrocephalus, extra-axial collection or mass lesion/mass effect. Vascular: No hyperdense vessel or unexpected calcification. Skull: Normal. Negative for fracture or focal lesion. Sinuses/Orbits: No acute finding. Other: None. IMPRESSION: Normal head CT. Electronically Signed   By: Lupita Raider M.D.   On: 10/13/2019 08:40   CT Cervical Spine Wo Contrast  Result Date: 10/13/2019 CLINICAL DATA:  Fall EXAM: CT CERVICAL SPINE WITHOUT CONTRAST TECHNIQUE: Multidetector CT imaging of the cervical spine was performed without intravenous contrast. Multiplanar CT image reconstructions were also generated. COMPARISON:  None. FINDINGS: Alignment: Normal Skull base and vertebrae: No acute fracture. No  primary bone lesion or focal pathologic process. Soft tissues and spinal canal: No prevertebral fluid or swelling. No visible canal hematoma. Disc levels:  Diffuse degenerative disc and facet disease. Upper chest: Biapical scarring. Other: None IMPRESSION: Cervical spondylosis.  No acute bony abnormality. Electronically Signed   By: Charlett Nose M.D.   On: 10/13/2019 08:45   DG HIP OPERATIVE UNILAT W OR W/O PELVIS LEFT  Result Date: 10/13/2019 CLINICAL DATA:  86 year old female undergoing ORIF. EXAM: OPERATIVE LEFT HIP (WITH PELVIS IF PERFORMED) 5 VIEWS TECHNIQUE: Fluoroscopic spot image(s) were submitted for interpretation post-operatively. COMPARISON:  left femur series this morning. FINDINGS: Five intraoperative fluoroscopic spot views of the left femur demonstrate placement of an intramedullary rod with proximal interlocking dynamic hip screw, proximal cerclage wire, and distal  interlocking cortical screw. Near anatomic alignment about the subtrochanteric fracture. FLUOROSCOPY TIME:  1 minutes 19 seconds IMPRESSION: Left femur ORIF with no adverse features. Electronically Signed   By: Odessa Fleming M.D.   On: 10/13/2019 14:59   DG FEMUR MIN 2 VIEWS LEFT  Result Date: 10/13/2019 CLINICAL DATA:  Fall today. EXAM: LEFT FEMUR 2 VIEWS COMPARISON:  None. FINDINGS: Severely displaced and comminuted fracture is seen involving the intertrochanteric region of the proximal left femur. IMPRESSION: Severely displaced and comminuted intertrochanteric fracture of proximal left femur. Electronically Signed   By: Lupita Raider M.D.   On: 10/13/2019 08:38    Disposition:      Contact information for follow-up providers    Evon Slack, PA-C Follow up in 2 week(s).   Specialties: Orthopedic Surgery, Emergency Medicine Contact information: [address] [place] [address] [phone]            Contact information for after-discharge care    Destination    [facility] Preferred SNF .   Service: Skilled Nursing Contact information: [address] [place] [address] [phone]                   Signed: Patience Musca 10/18/2019, 7:44 AM

## 2019-10-18 NOTE — TOC Progression Note (Signed)
Transition of Care [facility]) - Progression Note    Patient Details  Name: Kathy Mata MRN: [id number] Date of Birth: [birth year]  Transition of [facility]) CM/SW Contact  Trenton Founds, RN Phone Number: 10/18/2019, 8:34 AM  Clinical Narrative:   RNCM received word from [facility] with [facility] that they have insurance authorization, requested rapid Covid test from MD.     Expected Discharge Plan: Skilled Nursing Facility Barriers to Discharge: Continued Medical Work up  Expected Discharge Plan and Services Expected Discharge Plan: Skilled Nursing Facility     Post Acute Care Choice: Skilled Nursing Facility Living arrangements for the past 2 months: Single Family Home                                       Social Determinants of Health (SDOH) Interventions    Readmission Risk Interventions No flowsheet data found.

## 2019-10-18 NOTE — Progress Notes (Signed)
PT Cancellation Note  Patient Details Name: Kathy Mata MRN: [id number] DOB: 1924/06/11   Cancelled Treatment:     Pt is pending CT scan on head/R shoulder. Acute PT will continue to follow per current POC once results are reviewed and cleared for participation.    Rushie Chestnut 10/18/2019, 1:53 PM

## 2019-10-18 NOTE — Progress Notes (Addendum)
   Subjective: 5 Days Post-Op Procedure(s) (LRB): INTRAMEDULLARY (IM) NAIL INTERTROCHANTRIC (Left) Patient reports pain as mild.   Patient is well, and has had no acute complaints or problems  Denies any CP, SOB, ABD pain. We will continue with physical therapy today.  Patient making slow progress with PT.  Objective: Vital signs in last 24 hours: Temp:  [98.1 F (36.7 C)-98.4 F (36.9 C)] 98.2 F (36.8 C) (09/29 0727) Pulse Rate:  [68-90] 75 (09/29 0727) Resp:  [14-18] 17 (09/29 0727) BP: (133-159)/(42-89) 140/56 (09/29 0727) SpO2:  [94 %-99 %] 95 % (09/29 0727)  Intake/Output from previous day: 09/28 0701 - 09/29 0700 In: 240 [P.O.:240] Out: 500 [Urine:500] Intake/Output this shift: No intake/output data recorded.  Recent Labs    10/16/19 0527  HGB 8.2*   Recent Labs    10/16/19 0527  WBC 13.8*  RBC 2.61*  HCT 23.7*  PLT 101*   No results for input(s): NA, K, CL, CO2, BUN, CREATININE, GLUCOSE, CALCIUM in the last 72 hours. No results for input(s): LABPT, INR in the last 72 hours.  EXAM General - Patient is Alert, Appropriate and Confused Extremity - Neurovascular intact Sensation intact distally Intact pulses distally Dorsiflexion/Plantar flexion intact No cellulitis present Compartment soft Dressing - dressing C/D/I and no drainage, Praveena intact without drainage.  Ecchymosis noted along the posterior lateral hip. Motor Function - intact, moving foot and toes well on exam.   Past Medical History:  Diagnosis Date  . Hypertension   . Memory deficit     Assessment/Plan:   5 Days Post-Op Procedure(s) (LRB): INTRAMEDULLARY (IM) NAIL INTERTROCHANTRIC (Left) Active Problems:   Open femur fracture, left (HCC)  Estimated body mass index is 15.26 kg/m as calculated from the following:   Height as of this encounter: 5\' 5"  (1.651 m).   Weight as of this encounter: 41.6 kg. Advance diet Up with therapy  Vital signs are stable Acute post op blood loss  anemia - Continue with Iron supplements Pain well controlled. Work on bowel movement, suppository ordered  Care management to assist with discharge to skilled nursing facility, plan is to discharge to [facility] today. Covid test pending   DVT Prophylaxis - Lovenox, TED hose and SCDs Weight-Bearing as tolerated to left leg   T. , PA-C [facility] 10/18/2019, 7:36 AM

## 2019-10-18 NOTE — Progress Notes (Signed)
[facility] hospital liaison note:  Patient has a pending outpatient palliative referral, but was hospitalized with a fall to prior to appointment being made. TOC Misty [place] notified.  Dayna Barker BSN, RN, [facility] [employer] [phone]

## 2019-10-19 ENCOUNTER — Ambulatory Visit: Payer: Self-pay | Admitting: Licensed Clinical Social Worker

## 2019-10-19 DIAGNOSIS — F418 Other specified anxiety disorders: Secondary | ICD-10-CM | POA: Diagnosis not present

## 2019-10-19 DIAGNOSIS — F99 Mental disorder, not otherwise specified: Secondary | ICD-10-CM | POA: Diagnosis not present

## 2019-10-19 DIAGNOSIS — R5381 Other malaise: Secondary | ICD-10-CM | POA: Diagnosis not present

## 2019-10-19 DIAGNOSIS — F0390 Unspecified dementia without behavioral disturbance: Secondary | ICD-10-CM | POA: Diagnosis not present

## 2019-10-19 DIAGNOSIS — I739 Peripheral vascular disease, unspecified: Secondary | ICD-10-CM | POA: Diagnosis not present

## 2019-10-19 DIAGNOSIS — W1811XA Fall from or off toilet without subsequent striking against object, initial encounter: Secondary | ICD-10-CM | POA: Diagnosis not present

## 2019-10-19 DIAGNOSIS — D649 Anemia, unspecified: Secondary | ICD-10-CM | POA: Diagnosis not present

## 2019-10-19 DIAGNOSIS — E039 Hypothyroidism, unspecified: Secondary | ICD-10-CM | POA: Diagnosis not present

## 2019-10-19 DIAGNOSIS — E569 Vitamin deficiency, unspecified: Secondary | ICD-10-CM | POA: Diagnosis not present

## 2019-10-19 DIAGNOSIS — N390 Urinary tract infection, site not specified: Secondary | ICD-10-CM | POA: Diagnosis not present

## 2019-10-19 DIAGNOSIS — I10 Essential (primary) hypertension: Secondary | ICD-10-CM | POA: Diagnosis not present

## 2019-10-19 DIAGNOSIS — R279 Unspecified lack of coordination: Secondary | ICD-10-CM | POA: Diagnosis not present

## 2019-10-19 DIAGNOSIS — Z23 Encounter for immunization: Secondary | ICD-10-CM | POA: Diagnosis not present

## 2019-10-19 DIAGNOSIS — S72002D Fracture of unspecified part of neck of left femur, subsequent encounter for closed fracture with routine healing: Secondary | ICD-10-CM | POA: Diagnosis not present

## 2019-10-19 DIAGNOSIS — M6281 Muscle weakness (generalized): Secondary | ICD-10-CM | POA: Diagnosis not present

## 2019-10-19 DIAGNOSIS — F3289 Other specified depressive episodes: Secondary | ICD-10-CM | POA: Diagnosis not present

## 2019-10-19 DIAGNOSIS — S7222XD Displaced subtrochanteric fracture of left femur, subsequent encounter for closed fracture with routine healing: Secondary | ICD-10-CM | POA: Diagnosis not present

## 2019-10-19 DIAGNOSIS — Y92012 Bathroom of single-family (private) house as the place of occurrence of the external cause: Secondary | ICD-10-CM | POA: Diagnosis not present

## 2019-10-19 DIAGNOSIS — F0151 Vascular dementia with behavioral disturbance: Secondary | ICD-10-CM | POA: Diagnosis not present

## 2019-10-19 DIAGNOSIS — I6529 Occlusion and stenosis of unspecified carotid artery: Secondary | ICD-10-CM | POA: Diagnosis not present

## 2019-10-19 MED ORDER — INFLUENZA VAC A&B SA ADJ QUAD 0.5 ML IM PRSY
0.5000 mL | PREFILLED_SYRINGE | INTRAMUSCULAR | Status: AC | PRN
  Administered 2019-10-19: 0.5 mL via INTRAMUSCULAR
  Filled 2019-10-19: qty 0.5

## 2019-10-19 NOTE — Progress Notes (Signed)
shoulder and upper chest normal, no arthritis or fracture. Head CT normal. Should be stable for discharge today.

## 2019-10-19 NOTE — Chronic Care Management (AMB) (Signed)
  Care Management   Follow Up Note   10/19/2019 Name: Kathy Mata MRN: [id number] DOB: 1924/12/22  Referred by: [name], DO Reason for referral : Care Coordination   Kathy Mata is a 86 y.o. year old female who is a primary care patient of [name], DO. The care management team was consulted for assistance with care management and care coordination needs.    Review of patient status, including review of consultants reports, relevant laboratory and other test results, and collaboration with appropriate care team members and the patient's provider was performed as part of comprehensive patient evaluation and provision of chronic care management services.    LCSW received update from patient's daughter in law that patient will be discharging to [facility] today. She reports that she is unhappy with the care that patient received at [facility] as patient had a fall (climbed out of bed and ended up on the floor.) She states that she was NOT called about this incident. She wishes for CCM LCSW to document this update into patient's chart.   Dickie La, BSW, MSW, LCSW [facility] [place]  [facility] [email or web address] Phone: [phone]

## 2019-10-19 NOTE — Care Management Important Message (Signed)
Important Message  Patient Details  Name: Kathy Mata MRN: [id number] Date of Birth: July 03, 1924   Medicare Important Message Given:  Yes     Johnell Comings 10/19/2019, 1:22 PM

## 2019-10-19 NOTE — Progress Notes (Signed)
° °  Subjective: 6 Days Post-Op Procedure(s) (LRB): INTRAMEDULLARY (IM) NAIL INTERTROCHANTRIC (Left) Patient reports pain as mild.   Patient is well, and has had no acute complaints or problems  Denies any CP, SOB, ABD pain. We will continue with physical therapy today.   Objective: Vital signs in last 24 hours: Temp:  [98.1 F (36.7 C)] 98.1 F (36.7 C) (09/29 2332) Pulse Rate:  [86] 86 (09/29 2332) Resp:  [18] 18 (09/29 2332) BP: (115)/(86) 115/86 (09/29 2332) SpO2:  [96 %] 96 % (09/29 2332)  Intake/Output from previous day: 09/29 0701 - 09/30 0700 In: 0  Out: 550 [Urine:550] Intake/Output this shift: No intake/output data recorded.  No results for input(s): HGB in the last 72 hours. No results for input(s): WBC, RBC, HCT, PLT in the last 72 hours. No results for input(s): NA, K, CL, CO2, BUN, CREATININE, GLUCOSE, CALCIUM in the last 72 hours. No results for input(s): LABPT, INR in the last 72 hours.  EXAM General - Patient is Alert, Appropriate and Confused Extremity - Neurovascular intact Sensation intact distally Intact pulses distally Dorsiflexion/Plantar flexion intact No cellulitis present Compartment soft Dressing - dressing C/D/I and no drainage, Praveena intact without drainage Motor Function - intact, moving foot and toes well on exam.   Past Medical History:  Diagnosis Date   Hypertension    Memory deficit     Assessment/Plan:   6 Days Post-Op Procedure(s) (LRB): INTRAMEDULLARY (IM) NAIL INTERTROCHANTRIC (Left) Active Problems:   Open femur fracture, left (HCC)  Estimated body mass index is 15.26 kg/m as calculated from the following:   Height as of this encounter: 5\' 5"  (1.651 m).   Weight as of this encounter: 41.6 kg. Advance diet Up with therapy  Vital signs are stable Acute post op blood loss anemia -  Status post 1 unit packed red blood cells on 10/14/2019. Continue with Iron supplements Pain well controlled. Care management to assist  with discharge to skilled nursing facility  DVT Prophylaxis - Lovenox, TED hose and SCDs Weight-Bearing as tolerated to left leg   T. 10/16/2019, PA-C [facility] 10/19/2019, 9:10 AM

## 2019-10-19 NOTE — Plan of Care (Signed)
  Problem: Education: Goal: Knowledge of General Education information will improve Description: Including pain rating scale, medication(s)/side effects and non-pharmacologic comfort measures Outcome: Progressing   Problem: Health Behavior/Discharge Planning: Goal: Ability to manage health-related needs will improve Outcome: Progressing   Problem: Clinical Measurements: Goal: Ability to maintain clinical measurements within normal limits will improve Outcome: Progressing Goal: Will remain free from infection Outcome: Progressing Goal: Diagnostic test results will improve Outcome: Progressing Goal: Respiratory complications will improve Outcome: Progressing Goal: Cardiovascular complication will be avoided Outcome: Progressing   Problem: Activity: Goal: Risk for activity intolerance will decrease Outcome: Progressing   Problem: Nutrition: Goal: Adequate nutrition will be maintained Outcome: Progressing   Problem: Coping: Goal: Level of anxiety will decrease Outcome: Progressing   Problem: Elimination: Goal: Will not experience complications related to bowel motility Outcome: Progressing Goal: Will not experience complications related to urinary retention Outcome: Progressing   Problem: Pain Managment: Goal: General experience of comfort will improve Outcome: Progressing   Problem: Safety: Goal: Ability to remain free from injury will improve Outcome: Progressing   Problem: Skin Integrity: Goal: Risk for impaired skin integrity will decrease Outcome: Progressing   Problem: Education: Goal: Verbalization of understanding the information provided (i.e., activity precautions, restrictions, etc) will improve Outcome: Progressing Goal: Individualized Educational Video(s) Outcome: Progressing   Problem: Activity: Goal: Ability to ambulate and perform ADLs will improve Outcome: Progressing   Problem: Clinical Measurements: Goal: Postoperative complications will be  avoided or minimized Outcome: Progressing   Problem: Self-Concept: Goal: Ability to maintain and perform role responsibilities to the fullest extent possible will improve Outcome: Progressing   Problem: Pain Management: Goal: Pain level will decrease Outcome: Progressing   Problem: Education: Goal: Knowledge of the prescribed therapeutic regimen will improve Outcome: Progressing Goal: Understanding of discharge needs will improve Outcome: Progressing Goal: Individualized Educational Video(s) Outcome: Progressing   Problem: Activity: Goal: Ability to avoid complications of mobility impairment will improve Outcome: Progressing Goal: Ability to tolerate increased activity will improve Outcome: Progressing   Problem: Clinical Measurements: Goal: Postoperative complications will be avoided or minimized Outcome: Progressing   Problem: Pain Management: Goal: Pain level will decrease with appropriate interventions Outcome: Progressing   Problem: Skin Integrity: Goal: Will show signs of wound healing Outcome: Progressing   

## 2019-10-19 NOTE — Progress Notes (Signed)
Physical Therapy Treatment Patient Details Name: Kathy Mata MRN: [id number] DOB: August 21, 1924 Today's Date: 10/19/2019    History of Present Illness Pateint is s/p INTRAMEDULLARY (IM) NAIL INTERTROCHANTRIC (Left) with I&D of open fracture by Dr Rosita Kea    PT Comments    Pt was supine in bed with HOB elevated ~ 10 degrees. She is alert but disoriented. Sitter present at bedside. She did c/o of R shoulder/chest pain. Scans were negitive previous day for acute injury. Pt was able to exit R side of bed with min-mod assist + increased time. Constant vcs/tactle cues for safety. Poor insight of deficits however consistently follows commands and is cooperative and pleasant throughout. Pt stood to RW and ambulate ~ 15 ft with slow step to gait kinematics. HR/O2 stable throughout session. No LOB or unsteadiness. Less knee buckling this date versus previously observed. Breakfast tray arrived. Pt was repositioned in recliner with chair alarm in place, sitter present, and call bell in reach. Pt will benefit from continued skilled PT at local SNF to address deficits and improve safe functional mobility. Acute PT will contiue to follow per POC progressing as able.      Follow Up Recommendations  SNF     Equipment Recommendations  Other (comment) (defer to next level of care)    Recommendations for Other Services       Precautions / Restrictions Precautions Precautions: Fall Restrictions Weight Bearing Restrictions: No LLE Weight Bearing: Weight bearing as tolerated    Mobility  Bed Mobility Overal bed mobility: Needs Assistance Bed Mobility: Supine to Sit     Supine to sit: Min assist;Mod assist;HOB elevated (HOB only slightly elevated ~ 10 degrees)     General bed mobility comments: Pt was able to advance from supine to sit with increased time, vcs/tactile cues for sequencing and improved technique. Min-Mod for upper body progression with HHA  Transfers Overall transfer level: Needs  assistance Equipment used: Rolling walker (2 wheeled) Transfers: Sit to/from Stand Sit to Stand: Min assist;From elevated surface         General transfer comment: Pt was able to STS 2 x EOB with min assist. needs vcs for hand placement, fwd wt shift and overall improved technique  Ambulation/Gait Ambulation/Gait assistance: Min assist;Min guard Gait Distance (Feet): 15 Feet Assistive device: Rolling walker (2 wheeled) Gait Pattern/deviations: Step-to pattern;Antalgic;Trunk flexed;Narrow base of support Gait velocity: decreased   General Gait Details: Pt has very slow step to antalgic gait pattern. c/o chest/R shoulder pain. HR and O2 stable throughout on rm air       Balance Overall balance assessment: Needs assistance Sitting-balance support: No upper extremity supported;Feet supported Sitting balance-Leahy Scale: Fair Sitting balance - Comments: more sever R lateral lean in sitting due to L hip pain. required close supervision   Standing balance support: Bilateral upper extremity supported Standing balance-Leahy Scale: Fair Standing balance comment: Pt slightly impulsive at times letting go of RW during gait training however does not have LOB. high fall risk due to cognition and poor insightof deficits         Cognition Arousal/Alertness: Awake/alert Behavior During Therapy: WFL for tasks assessed/performed Overall Cognitive Status: History of cognitive impairments - at baseline        General Comments: Pt has cognitive deficits at baseline.          General Comments General comments (skin integrity, edema, etc.): Pt's breakfast arrived during session limiting gait distances and session progression. will return later this date to continue to progress ambulation  and overall safe functional mobility per POC.      Pertinent Vitals/Pain Pain Assessment: Faces Faces Pain Scale: Hurts a little bit Pain Location: did c/o minimal R arm/shoulder pain that did not limit  session. Pain Descriptors / Indicators: Sore;Operative site guarding Pain Intervention(s): Limited activity within patient's tolerance;Monitored during session;Repositioned           PT Goals (current goals can now be found in the care plan section) Acute Rehab PT Goals Patient Stated Goal: none stated Progress towards PT goals: Progressing toward goals    Frequency    BID      PT Plan Current plan remains appropriate       AM-PAC PT "6 Clicks" Mobility   Outcome Measure  Help needed turning from your back to your side while in a flat bed without using bedrails?: A Little Help needed moving from lying on your back to sitting on the side of a flat bed without using bedrails?: A Lot Help needed moving to and from a bed to a chair (including a wheelchair)?: A Lot Help needed standing up from a chair using your arms (e.g., wheelchair or bedside chair)?: A Lot Help needed to walk in hospital room?: A Lot Help needed climbing 3-5 steps with a railing? : A Lot 6 Click Score: 13    End of Session Equipment Utilized During Treatment: Gait belt Activity Tolerance: Patient tolerated treatment well Patient left: in chair;with call bell/phone within reach;with chair alarm set;with nursing/sitter in room Nurse Communication: Mobility status PT Visit Diagnosis: Unsteadiness on feet (R26.81);Muscle weakness (generalized) (M62.81);History of falling (Z91.81);Difficulty in walking, not elsewhere classified (R26.2)     Time: 0802-0820 PT Time Calculation (min) (ACUTE ONLY): 18 min  Charges:  $Gait Training: 8-22 mins                     Jetta Lout PTA 10/19/19, 8:34 AM

## 2019-10-19 NOTE — Progress Notes (Signed)
Changed wound vac to the prevena. Left hip honeycomb dressing intact.

## 2019-10-19 NOTE — Progress Notes (Signed)
Report called to Jessica at [employer]. She is aware of flu shot in left arm today, bowel movement today and need for bed alarm. Also she is aware of daughter in laws request to visit.

## 2019-10-22 DIAGNOSIS — F418 Other specified anxiety disorders: Secondary | ICD-10-CM | POA: Diagnosis not present

## 2019-10-22 DIAGNOSIS — S72002D Fracture of unspecified part of neck of left femur, subsequent encounter for closed fracture with routine healing: Secondary | ICD-10-CM | POA: Diagnosis not present

## 2019-10-22 DIAGNOSIS — F0390 Unspecified dementia without behavioral disturbance: Secondary | ICD-10-CM | POA: Diagnosis not present

## 2019-10-22 DIAGNOSIS — E039 Hypothyroidism, unspecified: Secondary | ICD-10-CM | POA: Diagnosis not present

## 2019-10-22 DIAGNOSIS — I10 Essential (primary) hypertension: Secondary | ICD-10-CM | POA: Diagnosis not present

## 2019-10-23 ENCOUNTER — Ambulatory Visit (INDEPENDENT_AMBULATORY_CARE_PROVIDER_SITE_OTHER): Payer: Medicare HMO | Admitting: General Practice

## 2019-10-23 DIAGNOSIS — E039 Hypothyroidism, unspecified: Secondary | ICD-10-CM

## 2019-10-23 DIAGNOSIS — F01518 Vascular dementia, unspecified severity, with other behavioral disturbance: Secondary | ICD-10-CM

## 2019-10-23 DIAGNOSIS — I10 Essential (primary) hypertension: Secondary | ICD-10-CM | POA: Diagnosis not present

## 2019-10-23 DIAGNOSIS — F0390 Unspecified dementia without behavioral disturbance: Secondary | ICD-10-CM

## 2019-10-23 DIAGNOSIS — I6529 Occlusion and stenosis of unspecified carotid artery: Secondary | ICD-10-CM | POA: Diagnosis not present

## 2019-10-23 DIAGNOSIS — I739 Peripheral vascular disease, unspecified: Secondary | ICD-10-CM | POA: Diagnosis not present

## 2019-10-23 DIAGNOSIS — F0151 Vascular dementia with behavioral disturbance: Secondary | ICD-10-CM

## 2019-10-23 DIAGNOSIS — F03A0 Unspecified dementia, mild, without behavioral disturbance, psychotic disturbance, mood disturbance, and anxiety: Secondary | ICD-10-CM

## 2019-10-23 DIAGNOSIS — I700 Atherosclerosis of aorta: Secondary | ICD-10-CM
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
## 2019-10-24 ENCOUNTER — Telehealth: Payer: Self-pay

## 2019-10-24 DIAGNOSIS — I10 Essential (primary) hypertension: Secondary | ICD-10-CM | POA: Diagnosis not present

## 2019-10-24 DIAGNOSIS — F3289 Other specified depressive episodes: Secondary | ICD-10-CM | POA: Diagnosis not present

## 2019-10-24 DIAGNOSIS — E039 Hypothyroidism, unspecified: Secondary | ICD-10-CM | POA: Diagnosis not present

## 2019-10-24 DIAGNOSIS — E569 Vitamin deficiency, unspecified: Secondary | ICD-10-CM | POA: Diagnosis not present

## 2019-10-24 NOTE — Telephone Encounter (Signed)
    MA10/05/2019 1st Attempt  Name: Kathy Mata   MRN: [id number]   DOB: 08-22-1924   AGE: 86 y.o.   GENDER: female   PCP [name], DO.   10/24/19 Left message on voicemail for daughter-in-law Blanca Friend to return my call regarding in-home support for patient.  Will call again this week.    Jenene Kauffmann, AAS Paralegal, [facility] Care Guide . Embedded Care Coordination [facility] Health  Care Management  [address] [place], [place] [address] [email or web address]  [phone]   [email or web address]

## 2019-10-26 ENCOUNTER — Telehealth: Payer: Self-pay

## 2019-10-26 NOTE — Telephone Encounter (Signed)
    MA10/07/2019 2nd Attempt  Name: Kathy Mata   MRN: [id number]   DOB: 03-12-1924   AGE: 86 y.o.   GENDER: female   PCP [name], DO.   10/26/19 Spoke with patient's daughter-in-law Kathy Mata about [facility], Care Management program and other resources for family caregivers.  Will follow-up in a few days.    Deylan Canterbury, AAS Paralegal, [facility] Care Guide . Embedded Care Coordination [facility] Health  Care Management  [address] [place], [place] [address] [email or web address]  [phone]   [email or web address]

## 2019-10-31 ENCOUNTER — Telehealth: Payer: Self-pay

## 2019-10-31 DIAGNOSIS — F0390 Unspecified dementia without behavioral disturbance: Secondary | ICD-10-CM | POA: Diagnosis not present

## 2019-10-31 DIAGNOSIS — E039 Hypothyroidism, unspecified: Secondary | ICD-10-CM | POA: Diagnosis not present

## 2019-10-31 DIAGNOSIS — F418 Other specified anxiety disorders: Secondary | ICD-10-CM | POA: Diagnosis not present

## 2019-10-31 DIAGNOSIS — S72002D Fracture of unspecified part of neck of left femur, subsequent encounter for closed fracture with routine healing: Secondary | ICD-10-CM | POA: Diagnosis not present

## 2019-10-31 DIAGNOSIS — D649 Anemia, unspecified: Secondary | ICD-10-CM | POA: Diagnosis not present

## 2019-10-31 DIAGNOSIS — I10 Essential (primary) hypertension: Secondary | ICD-10-CM | POA: Diagnosis not present

## 2019-10-31 NOTE — Telephone Encounter (Signed)
    MA10/12/2019 2nd Attempt  Name: Kathy Mata   MRN: [id number]   DOB: [birth year]   AGE: 86 y.o.   GENDER: female   PCP [name], DO.   10/31/19 Follow-up call.  Left message on voicemail for daughter-in-law Blanca Friend to return my call regarding assistance from Mliss Sax at [employer].     Orie Cuttino, AAS Paralegal, [facility] Care Guide . Embedded Care Coordination [facility] Health  Care Management  [address] [place], [place] [address] [email or web address]  [phone]   [email or web address]

## 2019-11-01 ENCOUNTER — Telehealth: Payer: Self-pay

## 2019-11-01 NOTE — Telephone Encounter (Signed)
    MA10/13/2021   Name: Kathy Mata   MRN: [id number]   DOB: [birth year]   AGE: 86 y.o.   GENDER: female   PCP [name], DO.   11/01/19 Received voicemail message from patient's daughter-in-law Kathy Mata the family is not interested in homecare for the patient. No other resources are needed at this tine. Closing referral.    Nithin Demeo, AAS Paralegal, [facility] Care Guide . Embedded Care Coordination [facility] Health  Care Management  [address] [place], [place] [address] [name]  [phone]   [email or web address]

## 2019-11-01 NOTE — Telephone Encounter (Signed)
    MA10/13/2021 2nd Attempt  Name: Aneisa Karren   MRN: [id number]   DOB: June 30, 1924   AGE: 86 y.o.   GENDER: female   PCP [name], DO.   11/01/19 2nd attempt. Follow-up call.  Left message on voicemail for daughter-in-law Blanca Friend to return my call regarding assistance from Mliss Sax at [employer].     Dareth Andrew, AAS Paralegal, [facility] Care Guide . Embedded Care Coordination [facility] Health  Care Management  [address] [place], [place] [address] [email or web address]  [phone]   [email or web address]

## 2019-11-08 DIAGNOSIS — F418 Other specified anxiety disorders: Secondary | ICD-10-CM | POA: Diagnosis not present

## 2019-11-08 DIAGNOSIS — E039 Hypothyroidism, unspecified: Secondary | ICD-10-CM | POA: Diagnosis not present

## 2019-11-08 DIAGNOSIS — I10 Essential (primary) hypertension: Secondary | ICD-10-CM | POA: Diagnosis not present

## 2019-11-08 DIAGNOSIS — F0390 Unspecified dementia without behavioral disturbance: Secondary | ICD-10-CM | POA: Diagnosis not present

## 2019-11-08 DIAGNOSIS — S72002D Fracture of unspecified part of neck of left femur, subsequent encounter for closed fracture with routine healing: Secondary | ICD-10-CM | POA: Diagnosis not present

## 2019-11-16 ENCOUNTER — Telehealth: Payer: Self-pay

## 2019-11-21 ENCOUNTER — Ambulatory Visit: Payer: Medicare HMO | Admitting: Licensed Clinical Social Worker

## 2019-11-21 DIAGNOSIS — R52 Pain, unspecified: Secondary | ICD-10-CM | POA: Diagnosis not present

## 2019-11-21 DIAGNOSIS — F419 Anxiety disorder, unspecified: Secondary | ICD-10-CM | POA: Diagnosis not present

## 2019-11-21 DIAGNOSIS — F0390 Unspecified dementia without behavioral disturbance: Secondary | ICD-10-CM

## 2019-11-21 DIAGNOSIS — F03A0 Unspecified dementia, mild, without behavioral disturbance, psychotic disturbance, mood disturbance, and anxiety: Secondary | ICD-10-CM

## 2019-11-21 DIAGNOSIS — F0150 Vascular dementia without behavioral disturbance: Secondary | ICD-10-CM | POA: Diagnosis not present

## 2019-11-21 DIAGNOSIS — R748 Abnormal levels of other serum enzymes: Secondary | ICD-10-CM | POA: Diagnosis not present

## 2019-11-21 DIAGNOSIS — F01518 Vascular dementia, unspecified severity, with other behavioral disturbance: Secondary | ICD-10-CM

## 2019-11-21 DIAGNOSIS — S7222XA Displaced subtrochanteric fracture of left femur, initial encounter for closed fracture: Secondary | ICD-10-CM | POA: Diagnosis not present

## 2019-11-21 DIAGNOSIS — I10 Essential (primary) hypertension: Secondary | ICD-10-CM | POA: Diagnosis not present

## 2019-11-21 DIAGNOSIS — F0151 Vascular dementia with behavioral disturbance: Secondary | ICD-10-CM

## 2019-11-21 DIAGNOSIS — I739 Peripheral vascular disease, unspecified: Secondary | ICD-10-CM

## 2019-11-21 DIAGNOSIS — E039 Hypothyroidism, unspecified: Secondary | ICD-10-CM | POA: Diagnosis not present

## 2019-11-21 NOTE — Chronic Care Management (AMB) (Signed)
Chronic Care Management    Clinical Social Work Follow Up Note  11/21/2019 Name: Kathy Mata MRN: [id number] DOB: [birth year]  Kathy Mata is a 86 y.o. year old female who is a primary care patient of Kathy Cords, DO. The CCM team was consulted for assistance with Level of Care Concerns.   Review of patient status, including review of consultants reports, other relevant assessments, and collaboration with appropriate care team members and the patient's provider was performed as part of comprehensive patient evaluation and provision of chronic care management services.    SDOH (Social Determinants of Health) assessments performed: Yes    Outpatient Encounter Medications as of 11/21/2019  Medication Sig Note  . donepezil (ARICEPT) 10 MG tablet Take 1 tablet (10 mg total) by mouth at bedtime.   . enoxaparin (LOVENOX) 30 MG/0.3ML injection Inject 0.3 mLs (30 mg total) into the skin daily.   Marland Kitchen escitalopram (LEXAPRO) 10 MG tablet Take 5 mg by mouth every other day. 10/13/2019: DAUGHTER STATES SHE IS WEANING OFF LEXAPRO  . ferrous fumarate-b12-vitamic C-folic acid (TRINSICON / FOLTRIN) capsule Take 1 capsule by mouth 2 (two) times daily after a meal.   . HYDROcodone-acetaminophen (NORCO/VICODIN) 5-325 MG tablet Take 1-2 tablets by mouth every 4 (four) hours as needed for moderate pain (pain score 4-6).   Marland Kitchen levothyroxine (SYNTHROID) 75 MCG tablet Take 1 tablet (75 mcg total) by mouth daily.   . metoprolol succinate (TOPROL-XL) 25 MG 24 hr tablet Take 1 tablet (25 mg total) by mouth daily.   . traZODone (DESYREL) 50 MG tablet Take 1 tablet (50 mg total) by mouth at bedtime.    No facility-administered encounter medications on file as of 11/21/2019.     Goals Addressed    .  SW- "We want ALF placement for [name]" (pt-stated)        CARE PLAN ENTRY (see longitudinal plan of care for additional care plan information)  Current Barriers:  . Limited social support . Level of care  concerns . Mental Health Concerns  . Social Isolation . Limited access to caregiver . Cognitive Deficits . Memory Deficits  Clinical Social Work Clinical Goal(s):   Marland Kitchen Over the next 120 days, patient/caregiver will work with SW to address concerns related to lack of support/resource connection and need for ALF placement. LCSW will assist patient/caregiver in gaining additional support/resource connection and community resource education in order to maintain health and mental health appropriately  . Over the next 120 days, family will apply for Special Assistance Medicaid. . Over the next 120 days, patient will demonstrate improved health management independence as evidenced by implementing healthy self-care skills and positive support/resources into her daily routine to help cope with stressors and improve overall health and well-being  . Over the next 120 days, patient or caregiver will verbalize basic understanding of depression/stress process and self health management plan as evidenced by her participation in development of long term plan of care and institution of self health management strategies  Interventions: . Inter-disciplinary care team collaboration (see longitudinal plan of care) . Patient interviewed and appropriate assessments performed. Patient is in need of ALF placement. LCSW provided [name] (caregiver) with extensive education on how long term care placement, Medicaid enrollment and their financial requirements and steps to transfer Medicaid if ever needed. CCM RNCM has already hand delivered a list of ALF's within [facility]. Patient ask if LCSW can email and mail all resource information to patient. LCSW will send resources out on 09/12/19. UPDATE- Patient's  caregiver reports that [name] declined patient for placement. [name] states that she contacted [name] and was informed that she would need to spend down patient's assets (through ALF placement) before she could qualify  for Special Assistance Medicaid. UPDATE ON 11/21/19- LCSW spoke with patient's daughter several times throughout the day. Patient is currently at [facility] under private pay. Patient has completed her 21 days of rehab and will need transitioning to long term care. However, patient's attorney applied for SKILLED NURSING MEDICAID instead of SPECIAL ASSISTANCE MEDICAID and was denied because of her income. Family will speak with attorney today about applying for Special Assistance Medicaid as they allow patients to have more assets with that eligibility requirement. Family state that they are still concerned that she may not be approved because patient's social security is over $2,000 per month. If patient does not qualify for Medicaid then they will have to start the spend down of assets process.  . SNF PT declined that patient needed skilled nursing at this time.  . Provided mental health counseling with regard to anxiety management. Patient continues to express great anxiety during 10 am-3 pm everyday. LCSW provided education on how patient can effectively manage those symptoms throughout the day better.  . Discussed plans with patient for ongoing care management follow up and provided patient with direct contact information for care management team . Advised patient/family to go to DSS to apply for Special Assistance Medicaid. Family wish to talk with their attorney first as patient has a trust that she is unable to touch or use for LTC placement expenses. Family has been unable to reach their attorney in order to discuss patient's trust. LCSW advised [name] to contact DSS for Special Assistance Medicaid information regarding patient's trust as this is out of our scope of practice. Caregiver reports that she has been unable to receive a return call back from DSS. LCSW emailed patient Medicaid application for her to complete and drop off at DSS. Marland Kitchen Collaborated with primary care provider and CCM RNCM re:  LTC placement concerns regarding payment . Assisted patient/caregiver with obtaining information about health plan benefits . Provided education and assistance to client regarding Advanced Directives. . Provided education to patient/caregiver regarding level of care options. . Provided education to patient/caregiver about Hospice and/or Palliative Care services . Caregiver was wondering if patient could take Trazodone in the morning or during the day as patient is experiencing the most anxiety from 10 am - 3 pm. Patient is lashing out the most on caregiver during this time as well. Per PCP, Regarding the medication change request. I would not change the current medication again, we just changed it yesterday. We are tapering off the Escitalopram, and switching to the Trazodone. The idea with the trazodone was to promote better sleep and improve the mood, it should provide some benefit throughout the day, not just only at bedtime only. I would advise against adding a PRN medicine for agitation/anxiety at this time - this can be complicated on when to use those medicines and most of them are not without risk. For right now, I would give the new rx Trazodone a chance, it will take several days to weeks to have full benefit. In the future, we may consider using a 2nd dose of Trazodone as a PRN in afternoon for agitation but I would not recommend starting that yet since we are just starting this medicine now. I think at some point a symptom management from a Palliative or  other input may be beneficial, but medicating for anxiety is not always the easiest or preferred option. Family was updated and is agreeable ot this plan.  . Family has started looking into memory care/ALF placement but are struggling to decide if they wish to pay out of pocket (spend down assets in order to qualify for Medicaid) or keep patient in the home and hire in home support. C3 referral made per [name]'s request for in home support  education. . DNR has been completed by PCP and picked up successfully by daughter.  . Patient remains not agreeable to LTC placement but family has POA and is able to place patient without her consent. Patient prefers to return to her residence in [place], [place]. Ongoing emotional support, care coordination and strategizing provided to caregiver in order to improve patient's health and safety.  Marland Kitchen UPDATE- 10/16/19- Patient had a fall over the weekend which led to a broke femur. Patient is currently at [facility]. Patient was recommened for SNF placement. Patient will receive 21 days of rehab and will then transfer to ALF. LCSW educated her on Special Assistance Medicaid and their enrollment process. LCSW sent caregiver a list of resources by email for her for reference.  Marland Kitchen UPDATE-10/16/19- LCSW received incoming call from caregiver/ Blanca Friend today. She reports that patient has been approved for Skilled Nursing for 21 days at [facility]. [facility] has ALF, Skilled Nursing AND Memory care and family would like for patient to transition from SNF to Memory Care LTC once she has completed 21 days of therapy. LCSW encouraged family to start Medicaid application ASAP and to contact the admissions department and social worker at [facility] to discuss LTC planning. Family agreeable. LCSW will update CCM team and PCP.  Marland Kitchen Update 11/21/19-Patient is still at [facility]. Family report that patient is not doing well and will lay in the bed all day in the dark. Family report that they have filed 3 insurance appeals for SNF coverage but PT declined the need for skilled nursing which will be a major barrier in terms of approval. Family is adamant that patient cannot return back home due to safety concerns.   Patient Self Care Activities:  . Attends all scheduled provider appointments . Lacks social connections  Please see past updates related to this goal by clicking on the "Past Updates" button in the selected goal       Follow Up Plan: SW will follow up with patient by phone over the next quarter  Dickie La, Winner, MSW, LCSW [facility]  [facility] [email or web address] Phone: [phone]

## 2019-11-22 ENCOUNTER — Ambulatory Visit: Payer: Self-pay | Admitting: Licensed Clinical Social Worker

## 2019-11-22 DIAGNOSIS — I10 Essential (primary) hypertension: Secondary | ICD-10-CM

## 2019-11-22 DIAGNOSIS — F0151 Vascular dementia with behavioral disturbance: Secondary | ICD-10-CM

## 2019-11-22 DIAGNOSIS — I739 Peripheral vascular disease, unspecified: Secondary | ICD-10-CM

## 2019-11-22 DIAGNOSIS — F01518 Vascular dementia, unspecified severity, with other behavioral disturbance: Secondary | ICD-10-CM

## 2019-11-22 DIAGNOSIS — E039 Hypothyroidism, unspecified: Secondary | ICD-10-CM

## 2019-11-22 NOTE — Chronic Care Management (AMB) (Signed)
Chronic Care Management    Clinical Social Work Follow Up Note  11/22/2019 Name: Kathy Mata MRN: [id number] DOB: 1924-09-08  Kathy Mata is a 86 y.o. year old female who is a primary care patient of Kathy Cords, DO. The CCM team was consulted for assistance with Level of Care Concerns.   Review of patient status, including review of consultants reports, other relevant assessments, and collaboration with appropriate care team members and the patient's provider was performed as part of comprehensive patient evaluation and provision of chronic care management services.    SDOH (Social Determinants of Health) assessments performed: Yes    Outpatient Encounter Medications as of 11/22/2019  Medication Sig Note   donepezil (ARICEPT) 10 MG tablet Take 1 tablet (10 mg total) by mouth at bedtime.    enoxaparin (LOVENOX) 30 MG/0.3ML injection Inject 0.3 mLs (30 mg total) into the skin daily.    escitalopram (LEXAPRO) 10 MG tablet Take 5 mg by mouth every other day. 10/13/2019: DAUGHTER STATES SHE IS WEANING OFF LEXAPRO   ferrous fumarate-b12-vitamic C-folic acid (TRINSICON / FOLTRIN) capsule Take 1 capsule by mouth 2 (two) times daily after a meal.    HYDROcodone-acetaminophen (NORCO/VICODIN) 5-325 MG tablet Take 1-2 tablets by mouth every 4 (four) hours as needed for moderate pain (pain score 4-6).    levothyroxine (SYNTHROID) 75 MCG tablet Take 1 tablet (75 mcg total) by mouth daily.    metoprolol succinate (TOPROL-XL) 25 MG 24 hr tablet Take 1 tablet (25 mg total) by mouth daily.    traZODone (DESYREL) 50 MG tablet Take 1 tablet (50 mg total) by mouth at bedtime.    No facility-administered encounter medications on file as of 11/22/2019.     Goals Addressed      SW- "We want ALF placement for Kathy Mata" (pt-stated)        CARE PLAN ENTRY (see longitudinal plan of care for additional care plan information)  Current Barriers:   Limited social support  Level of care  concerns  Mental Health Concerns   Social Isolation  Limited access to caregiver  Cognitive Deficits  Memory Deficits  Clinical Social Work Clinical Goal(s):    Over the next 120 days, patient/caregiver will work with SW to address concerns related to lack of support/resource connection and need for ALF placement. LCSW will assist patient/caregiver in gaining additional support/resource connection and community resource education in order to maintain health and mental health appropriately   Over the next 120 days, family will apply for Special Assistance Medicaid.  Over the next 120 days, patient will demonstrate improved health management independence as evidenced by implementing healthy self-care skills and positive support/resources into her daily routine to help cope with stressors and improve overall health and well-being   Over the next 120 days, patient or caregiver will verbalize basic understanding of depression/stress process and self health management plan as evidenced by her participation in development of long term plan of care and institution of self health management strategies  Interventions:  Inter-disciplinary care team collaboration (see longitudinal plan of care)  Patient interviewed and appropriate assessments performed. Patient is in need of ALF placement. LCSW provided Kathy Mata (caregiver) with extensive education on how long term care placement, Medicaid enrollment and their financial requirements and steps to transfer Medicaid if ever needed. CCM RNCM has already hand delivered a list of ALF's within [facility]. Patient ask if LCSW can email and mail all resource information to patient. LCSW will send resources out on 09/12/19. UPDATE- Patient's  caregiver reports that Kathy [facility] declined patient for placement. Kathy Mata states that she contacted Kathy Mata and was informed that she would need to spend down patient's assets (through ALF placement) before she could qualify  for Special Assistance Medicaid. UPDATE ON 11/21/19- LCSW spoke with patient's daughter several times throughout the day. Patient is currently at [facility] under private pay. Patient has completed her 21 days of rehab and will need transitioning to long term care. However, patient's attorney applied for SKILLED NURSING MEDICAID instead of SPECIAL ASSISTANCE MEDICAID and was denied because of her income. Family will speak with attorney today about applying for Special Assistance Medicaid as they allow patients to have more assets with that eligibility requirement. Family state that they are still concerned that she may not be approved because patient's social security is over $2,000 per month. If patient does not qualify for Medicaid then they will have to start the spend down of assets process.   SNF PT declined that patient needed skilled nursing at this time.   Provided mental health counseling with regard to anxiety management. Patient continues to express great anxiety during 10 am-3 pm everyday. LCSW provided education on how patient can effectively manage those symptoms throughout the day better.   Discussed plans with patient for ongoing care management follow up and provided patient with direct contact information for care management team  Advised patient/family to go to DSS to apply for Special Assistance Medicaid. Family wish to talk with their attorney first as patient has a trust that she is unable to touch or use for LTC placement expenses. Family has been unable to reach their attorney in order to discuss patient's trust. LCSW advised Kathy Mata to contact DSS for Special Assistance Medicaid information regarding patient's trust as this is out of our scope of practice. Caregiver reports that she has been unable to receive a return call back from DSS. LCSW emailed patient Medicaid application for her to complete and drop off at DSS.  Collaborated with primary care provider and CCM RNCM re:  LTC placement concerns regarding payment  Assisted patient/caregiver with obtaining information about health plan benefits  Provided education and assistance to client regarding Advanced Directives.  Provided education to patient/caregiver regarding level of care options.  Provided education to patient/caregiver about Hospice and/or Palliative Care services  Caregiver was wondering if patient could take Trazodone in the morning or during the day as patient is experiencing the most anxiety from 10 am - 3 pm. Patient is lashing out the most on caregiver during this time as well. Per PCP, Regarding the medication change request. I would not change the current medication again, we just changed it yesterday. We are tapering off the Escitalopram, and switching to the Trazodone. The idea with the trazodone was to promote better sleep and improve the mood, it should provide some benefit throughout the day, not just only at bedtime only. I would advise against adding a PRN medicine for agitation/anxiety at this time - this can be complicated on when to use those medicines and most of them are not without risk. For right now, I would give the new rx Trazodone a chance, it will take several days to weeks to have full benefit. In the future, we may consider using a 2nd dose of Trazodone as a PRN in afternoon for agitation but I would not recommend starting that yet since we are just starting this medicine now. I think at some point a symptom management from a Palliative or  other input may be beneficial, but medicating for anxiety is not always the easiest or preferred option. Family was updated and is agreeable ot this plan.   Family has started looking into memory care/ALF placement but are struggling to decide if they wish to pay out of pocket (spend down assets in order to qualify for Medicaid) or keep patient in the home and hire in home support. C3 referral made per Kathy Mata's request for in home support  education.  DNR has been completed by PCP and picked up successfully by daughter.   Patient remains not agreeable to LTC placement but family has POA and is able to place patient without her consent. Patient prefers to return to her residence in [place], [place]. Ongoing emotional support, care coordination and strategizing provided to caregiver in order to improve patient's health and safety.   UPDATE- 10/16/19- Patient had a fall over the weekend which led to a broke femur. Patient is currently at [facility]. Patient was recommened for SNF placement. Patient will receive 21 days of rehab and will then transfer to ALF. LCSW educated her on Special Assistance Medicaid and their enrollment process. LCSW sent caregiver a list of resources by email for her for reference.   UPDATE-10/16/19- LCSW received incoming call from caregiver/ Kathy Mata today. She reports that patient has been approved for Skilled Nursing for 21 days at [facility]. [facility] has ALF, Skilled Nursing AND Memory care and family would like for patient to transition from SNF to Memory Care LTC once she has completed 21 days of therapy. LCSW encouraged family to start Medicaid application ASAP and to contact the admissions department and social worker at [facility] to discuss LTC planning. Family agreeable. LCSW will update CCM team and PCP.   Update 11/21/19-Patient is still at [facility]. Family report that patient is not doing well and will lay in the bed all day in the dark. Family report that they have filed 3 insurance appeals for SNF coverage but PT declined the need for skilled nursing which will be a major barrier in terms of approval. Family is adamant that patient cannot return back home due to safety concerns. 11/22/19- Patient's daughter contacted CCM LCSW for general Medicaid enrollment questions. Daughter reports that she is unsure if patient will even qualify for Special Assistance Medicaid but that their attorney will be  helping them to apply for this service. LCSW provided education on how to complete Medicaid application for ALF and their eligibility requirements.  Patient Self Care Activities:   Attends all scheduled provider appointments  Lacks social connections  Please see past updates related to this goal by clicking on the "Past Updates" button in the selected goal       Follow Up Plan: SW will follow up with patient by phone over the next 30-60 days  Dickie La, BSW, MSW, LCSW [facility]   [facility] [email or web address] Phone: [phone]

## 2019-11-24 ENCOUNTER — Telehealth: Payer: Self-pay | Admitting: General Practice

## 2019-11-24 NOTE — Telephone Encounter (Signed)
  Chronic Care Management   Note  11/24/2019 Name: [name] MRN: [id number] DOB: 01/07/1925  Coordination with the LCSW, Dickie La.  Kathy Mata is working with the family to assist.  The patient will be staying in SNF long term.  There are no further RNCM needs at this time.  RNCM closing case at this time. LCSW will contact RNCM for further needs and assistance with this patient.   Follow up plan: No further follow up required: no further follow up from the [facility] needed   Alto Denver RN, MSN, CCM Community Care Coordinator [facility]  [facility] [place] Mobile: [phone]

## 2019-11-30 DIAGNOSIS — E039 Hypothyroidism, unspecified: Secondary | ICD-10-CM | POA: Diagnosis not present

## 2019-11-30 DIAGNOSIS — I10 Essential (primary) hypertension: Secondary | ICD-10-CM | POA: Diagnosis not present

## 2019-11-30 DIAGNOSIS — F0150 Vascular dementia without behavioral disturbance: Secondary | ICD-10-CM | POA: Diagnosis not present

## 2019-11-30 DIAGNOSIS — F419 Anxiety disorder, unspecified: Secondary | ICD-10-CM | POA: Diagnosis not present

## 2019-11-30 DIAGNOSIS — S7222XA Displaced subtrochanteric fracture of left femur, initial encounter for closed fracture: Secondary | ICD-10-CM | POA: Diagnosis not present

## 2019-12-01 DIAGNOSIS — S7225XD Nondisplaced subtrochanteric fracture of left femur, subsequent encounter for closed fracture with routine healing: Secondary | ICD-10-CM | POA: Diagnosis not present

## 2019-12-01 DIAGNOSIS — Z8781 Personal history of (healed) traumatic fracture: Secondary | ICD-10-CM | POA: Diagnosis not present

## 2019-12-01 DIAGNOSIS — Z9889 Other specified postprocedural states: Secondary | ICD-10-CM | POA: Diagnosis not present

## 2019-12-21 ENCOUNTER — Telehealth: Payer: Self-pay

## 2019-12-21 DIAGNOSIS — F0150 Vascular dementia without behavioral disturbance: Secondary | ICD-10-CM | POA: Diagnosis not present

## 2019-12-21 DIAGNOSIS — R52 Pain, unspecified: Secondary | ICD-10-CM | POA: Diagnosis not present

## 2019-12-21 DIAGNOSIS — S7222XA Displaced subtrochanteric fracture of left femur, initial encounter for closed fracture: Secondary | ICD-10-CM | POA: Diagnosis not present

## 2019-12-21 DIAGNOSIS — D509 Iron deficiency anemia, unspecified: Secondary | ICD-10-CM | POA: Diagnosis not present

## 2019-12-21 DIAGNOSIS — E039 Hypothyroidism, unspecified: Secondary | ICD-10-CM | POA: Diagnosis not present

## 2019-12-28 DIAGNOSIS — R051 Acute cough: Secondary | ICD-10-CM | POA: Diagnosis not present

## 2019-12-28 DIAGNOSIS — J309 Allergic rhinitis, unspecified: Secondary | ICD-10-CM | POA: Diagnosis not present

## 2019-12-28 DIAGNOSIS — R059 Cough, unspecified: Secondary | ICD-10-CM | POA: Diagnosis not present

## 2019-12-28 DIAGNOSIS — I10 Essential (primary) hypertension: Secondary | ICD-10-CM | POA: Diagnosis not present

## 2019-12-28 DIAGNOSIS — R0989 Other specified symptoms and signs involving the circulatory and respiratory systems: Secondary | ICD-10-CM | POA: Diagnosis not present

## 2019-12-29 DIAGNOSIS — I10 Essential (primary) hypertension: Secondary | ICD-10-CM | POA: Diagnosis not present

## 2020-01-03 DIAGNOSIS — R059 Cough, unspecified: Secondary | ICD-10-CM | POA: Diagnosis not present

## 2020-01-03 DIAGNOSIS — R062 Wheezing: Secondary | ICD-10-CM | POA: Diagnosis not present

## 2020-01-03 DIAGNOSIS — F062 Psychotic disorder with delusions due to known physiological condition: Secondary | ICD-10-CM | POA: Diagnosis not present

## 2020-01-03 DIAGNOSIS — F0150 Vascular dementia without behavioral disturbance: Secondary | ICD-10-CM | POA: Diagnosis not present

## 2020-01-03 DIAGNOSIS — R070 Pain in throat: Secondary | ICD-10-CM | POA: Diagnosis not present

## 2020-01-04 DIAGNOSIS — R062 Wheezing: Secondary | ICD-10-CM | POA: Diagnosis not present

## 2020-01-04 DIAGNOSIS — R070 Pain in throat: Secondary | ICD-10-CM | POA: Diagnosis not present

## 2020-01-04 DIAGNOSIS — F0150 Vascular dementia without behavioral disturbance: Secondary | ICD-10-CM | POA: Diagnosis not present

## 2020-01-08 DIAGNOSIS — Z9181 History of falling: Secondary | ICD-10-CM | POA: Diagnosis not present

## 2020-01-08 DIAGNOSIS — I10 Essential (primary) hypertension: Secondary | ICD-10-CM | POA: Diagnosis not present

## 2020-01-08 DIAGNOSIS — F0150 Vascular dementia without behavioral disturbance: Secondary | ICD-10-CM | POA: Diagnosis not present

## 2020-01-08 DIAGNOSIS — R062 Wheezing: Secondary | ICD-10-CM | POA: Diagnosis not present

## 2020-01-08 DIAGNOSIS — R070 Pain in throat: Secondary | ICD-10-CM | POA: Diagnosis not present

## 2020-01-10 ENCOUNTER — Telehealth: Payer: Medicare HMO

## 2020-01-10 ENCOUNTER — Ambulatory Visit: Payer: Medicare HMO | Admitting: Licensed Clinical Social Worker

## 2020-01-10 DIAGNOSIS — E039 Hypothyroidism, unspecified: Secondary | ICD-10-CM

## 2020-01-10 DIAGNOSIS — I10 Essential (primary) hypertension: Secondary | ICD-10-CM

## 2020-01-10 DIAGNOSIS — F01518 Vascular dementia, unspecified severity, with other behavioral disturbance: Secondary | ICD-10-CM

## 2020-01-10 DIAGNOSIS — I739 Peripheral vascular disease, unspecified: Secondary | ICD-10-CM

## 2020-01-10 NOTE — Chronic Care Management (AMB) (Signed)
Chronic Care Management    Clinical Social Work Follow Up Note  01/10/2020 Name: Kathy Mata MRN: [id number] DOB: 05/04/1924  Kathy Mata is a 86 y.o. year old female who is a primary care patient of Kathy Cords, DO. The CCM team was consulted for assistance with Level of Care Concerns.   Review of patient status, including review of consultants reports, other relevant assessments, and collaboration with appropriate care team members and the patient's provider was performed as part of comprehensive patient evaluation and provision of chronic care management services.    SDOH (Social Determinants of Health) assessments performed: Yes    Outpatient Encounter Medications as of 01/10/2020  Medication Sig Note  . donepezil (ARICEPT) 10 MG tablet Take 1 tablet (10 mg total) by mouth at bedtime.   . enoxaparin (LOVENOX) 30 MG/0.3ML injection Inject 0.3 mLs (30 mg total) into the skin daily.   Marland Kitchen escitalopram (LEXAPRO) 10 MG tablet Take 5 mg by mouth every other day. 10/13/2019: Kathy Mata STATES SHE IS WEANING OFF LEXAPRO  . ferrous fumarate-b12-vitamic C-folic acid (TRINSICON / FOLTRIN) capsule Take 1 capsule by mouth 2 (two) times daily after a meal.   . HYDROcodone-acetaminophen (NORCO/VICODIN) 5-325 MG tablet Take 1-2 tablets by mouth every 4 (four) hours as needed for moderate pain (pain score 4-6).   Marland Kitchen levothyroxine (SYNTHROID) 75 MCG tablet Take 1 tablet (75 mcg total) by mouth daily.   . metoprolol succinate (TOPROL-XL) 25 MG 24 hr tablet Take 1 tablet (25 mg total) by mouth daily.   . traZODone (DESYREL) 50 MG tablet Take 1 tablet (50 mg total) by mouth at bedtime.    No facility-administered encounter medications on file as of 01/10/2020.     Goals Addressed    .  COMPLETED: SW- "We want ALF placement for Kathy Mata" (pt-stated)        CARE PLAN ENTRY (see longitudinal plan of care for additional care plan information)  Current Barriers:  . Limited social support . Level  of care concerns . Mental Health Concerns  . Social Isolation . Limited access to caregiver . Cognitive Deficits . Memory Deficits  Clinical Social Work Clinical Goal(s):   Marland Kitchen Over the next 120 days, patient/caregiver will work with SW to address concerns related to lack of support/resource connection and need for ALF placement. LCSW will assist patient/caregiver in gaining additional support/resource connection and community resource education in order to maintain health and mental health appropriately  . Over the next 120 days, family will apply for Special Assistance Medicaid. . Over the next 120 days, patient will demonstrate improved health management independence as evidenced by implementing healthy self-care skills and positive support/resources into her daily routine to help cope with stressors and improve overall health and well-being  . Over the next 120 days, patient or caregiver will verbalize basic understanding of depression/stress process and self health management plan as evidenced by her participation in development of long term plan of care and institution of self health management strategies  Interventions: . Inter-disciplinary care team collaboration (see longitudinal plan of care) . Patient interviewed and appropriate assessments performed. Patient is in need of ALF placement. LCSW provided Kathy Mata (caregiver) with extensive education on how long term care placement, Medicaid enrollment and their financial requirements and steps to transfer Medicaid if ever needed. CCM RNCM has already hand delivered a list of ALF's within [facility]. Patient ask if LCSW can email and mail all resource information to patient. LCSW will send resources out on 09/12/19. UPDATE-  Patient's caregiver reports that Kathy Mata declined patient for placement. Kathy Mata states that she contacted Kathy Mata and was informed that she would need to spend down patient's assets (through ALF placement) before she could  qualify for Special Assistance Medicaid. UPDATE ON 11/21/19- LCSW spoke with patient's Kathy Mata several times throughout the day. Patient is currently at [facility] under private pay. Patient has completed her 21 days of rehab and will need transitioning to long term care. However, patient's attorney applied for SKILLED NURSING MEDICAID instead of SPECIAL ASSISTANCE MEDICAID and was denied because of her income. Family will speak with attorney today about applying for Special Assistance Medicaid as they allow patients to have more assets with that eligibility requirement. Family state that they are still concerned that she may not be approved because patient's social security is over $2,000 per month. If patient does not qualify for Medicaid then they will have to start the spend down of assets process. UPDATE 01/10/20-- Patient was successfully placed at [facility].  Marland Kitchen Provided mental health counseling with regard to anxiety management. Patient continues to express great anxiety during 10 am-3 pm everyday. LCSW provided education on how patient can effectively manage those symptoms throughout the day better.  . Discussed plans with patient for ongoing care management follow up and provided patient with direct contact information for care management team . Advised patient/family to go to DSS to apply for Special Assistance Medicaid. Family wish to talk with their attorney first as patient has a trust that she is unable to touch or use for LTC placement expenses. Family has been unable to reach their attorney in order to discuss patient's trust. LCSW advised Kathy Mata to contact DSS for Special Assistance Medicaid information regarding patient's trust as this is out of our scope of practice. Caregiver reports that she has been unable to receive a return call back from DSS. LCSW emailed patient Medicaid application for her to complete and drop off at DSS. Marland Kitchen Collaborated with primary care provider and CCM  RNCM re: LTC placement concerns regarding payment . Assisted patient/caregiver with obtaining information about health plan benefits . Provided education and assistance to client regarding Advanced Directives. . Provided education to patient/caregiver regarding level of care options. . Provided education to patient/caregiver about Hospice and/or Palliative Care services . Caregiver was wondering if patient could take Trazodone in the morning or during the day as patient is experiencing the most anxiety from 10 am - 3 pm. Patient is lashing out the most on caregiver during this time as well. Per PCP, Regarding the medication change request. I would not change the current medication again, we just changed it yesterday. We are tapering off the Escitalopram, and switching to the Trazodone. The idea with the trazodone was to promote better sleep and improve the mood, it should provide some benefit throughout the day, not just only at bedtime only. I would advise against adding a PRN medicine for agitation/anxiety at this time - this can be complicated on when to use those medicines and most of them are not without risk. For right now, I would give the new rx Trazodone a chance, it will take several days to weeks to have full benefit. In the future, we may consider using a 2nd dose of Trazodone as a PRN in afternoon for agitation but I would not recommend starting that yet since we are just starting this medicine now. I think at some point a symptom management from a Palliative or other input  may be beneficial, but medicating for anxiety is not always the easiest or preferred option. Family was updated and is agreeable ot this plan.  . Family has started looking into memory care/ALF placement but are struggling to decide if they wish to pay out of pocket (spend down assets in order to qualify for Medicaid) or keep patient in the home and hire in home support. C3 referral made per [name]'s request for in home support  education. . DNR has been completed by PCP and picked up successfully by Kathy Mata.  . Patient remains not agreeable to LTC placement but family has POA and is able to place patient without her consent. Patient prefers to return to her residence in [place], [place]. Ongoing emotional support, care coordination and strategizing provided to caregiver in order to improve patient's health and safety.  . Patient had a fall while at ALF this past week in the bathroom. Family report no injuries from this fall.  . Patient is still at [facility]. Family report that patient is not doing well and will lay in the bed all day in the dark. LCSW provided family with a list of Assisted Living Faciltiies that accept Medicaid within the area. Family is interested in transitioning patient to a different ALF at some point.  . Attends all scheduled provider appointments . Lacks social connections  Please see past updates related to this goal by clicking on the "Past Updates" button in the selected goal      Dickie La, [place], MSW, LCSW [facility]  [facility] [email or web address] Phone: [phone]

## 2020-01-12 ENCOUNTER — Inpatient Hospital Stay
Admission: EM | Admit: 2020-01-12 | Discharge: 2020-01-20 | DRG: 871 | Disposition: E | Payer: Medicare HMO | Source: Skilled Nursing Facility | Attending: Internal Medicine | Admitting: Internal Medicine

## 2020-01-12 ENCOUNTER — Other Ambulatory Visit: Payer: Self-pay

## 2020-01-12 ENCOUNTER — Emergency Department: Payer: Medicare HMO

## 2020-01-12 DIAGNOSIS — J9600 Acute respiratory failure, unspecified whether with hypoxia or hypercapnia: Secondary | ICD-10-CM | POA: Diagnosis not present

## 2020-01-12 DIAGNOSIS — Z20822 Contact with and (suspected) exposure to covid-19: Secondary | ICD-10-CM | POA: Diagnosis present

## 2020-01-12 DIAGNOSIS — R0602 Shortness of breath: Secondary | ICD-10-CM | POA: Diagnosis not present

## 2020-01-12 DIAGNOSIS — A419 Sepsis, unspecified organism: Principal | ICD-10-CM | POA: Diagnosis present

## 2020-01-12 DIAGNOSIS — F32A Depression, unspecified: Secondary | ICD-10-CM | POA: Diagnosis present

## 2020-01-12 DIAGNOSIS — I739 Peripheral vascular disease, unspecified: Secondary | ICD-10-CM | POA: Diagnosis present

## 2020-01-12 DIAGNOSIS — Z7902 Long term (current) use of antithrombotics/antiplatelets: Secondary | ICD-10-CM | POA: Diagnosis not present

## 2020-01-12 DIAGNOSIS — R6521 Severe sepsis with septic shock: Secondary | ICD-10-CM | POA: Diagnosis not present

## 2020-01-12 DIAGNOSIS — J9601 Acute respiratory failure with hypoxia: Secondary | ICD-10-CM | POA: Diagnosis present

## 2020-01-12 DIAGNOSIS — F0150 Vascular dementia without behavioral disturbance: Secondary | ICD-10-CM | POA: Diagnosis not present

## 2020-01-12 DIAGNOSIS — R0902 Hypoxemia: Secondary | ICD-10-CM | POA: Diagnosis not present

## 2020-01-12 DIAGNOSIS — Z79899 Other long term (current) drug therapy: Secondary | ICD-10-CM

## 2020-01-12 DIAGNOSIS — E039 Hypothyroidism, unspecified: Secondary | ICD-10-CM | POA: Diagnosis present

## 2020-01-12 DIAGNOSIS — Z515 Encounter for palliative care: Secondary | ICD-10-CM | POA: Diagnosis not present

## 2020-01-12 DIAGNOSIS — F419 Anxiety disorder, unspecified: Secondary | ICD-10-CM | POA: Diagnosis present

## 2020-01-12 DIAGNOSIS — R069 Unspecified abnormalities of breathing: Secondary | ICD-10-CM | POA: Diagnosis not present

## 2020-01-12 DIAGNOSIS — I10 Essential (primary) hypertension: Secondary | ICD-10-CM | POA: Diagnosis present

## 2020-01-12 DIAGNOSIS — Z79890 Hormone replacement therapy (postmenopausal): Secondary | ICD-10-CM

## 2020-01-12 DIAGNOSIS — J181 Lobar pneumonia, unspecified organism: Secondary | ICD-10-CM | POA: Diagnosis present

## 2020-01-12 DIAGNOSIS — R404 Transient alteration of awareness: Secondary | ICD-10-CM | POA: Diagnosis not present

## 2020-01-12 DIAGNOSIS — R0603 Acute respiratory distress: Secondary | ICD-10-CM | POA: Diagnosis not present

## 2020-01-12 DIAGNOSIS — Z66 Do not resuscitate: Secondary | ICD-10-CM | POA: Diagnosis present

## 2020-01-12 DIAGNOSIS — R0689 Other abnormalities of breathing: Secondary | ICD-10-CM | POA: Diagnosis not present

## 2020-01-12 LAB — CBC WITH DIFFERENTIAL/PLATELET
Abs Immature Granulocytes: 0.92 10*3/uL — ABNORMAL HIGH (ref 0.00–0.07)
Basophils Absolute: 0 10*3/uL (ref 0.0–0.1)
Basophils Relative: 0 %
Eosinophils Absolute: 0 10*3/uL (ref 0.0–0.5)
Eosinophils Relative: 0 %
HCT: 43.1 % (ref 36.0–46.0)
Hemoglobin: 14.1 g/dL (ref 12.0–15.0)
Immature Granulocytes: 4 %
Lymphocytes Relative: 3 %
Lymphs Abs: 0.7 10*3/uL (ref 0.7–4.0)
MCH: 30.2 pg (ref 26.0–34.0)
MCHC: 32.7 g/dL (ref 30.0–36.0)
MCV: 92.3 fL (ref 80.0–100.0)
Monocytes Absolute: 2.9 10*3/uL — ABNORMAL HIGH (ref 0.1–1.0)
Monocytes Relative: 11 %
Neutro Abs: 21.2 10*3/uL — ABNORMAL HIGH (ref 1.7–7.7)
Neutrophils Relative %: 82 %
Platelets: 335 10*3/uL (ref 150–400)
RBC: 4.67 MIL/uL (ref 3.87–5.11)
RDW: 13.6 % (ref 11.5–15.5)
Smear Review: NORMAL
WBC: 25.7 10*3/uL — ABNORMAL HIGH (ref 4.0–10.5)
nRBC: 0 % (ref 0.0–0.2)

## 2020-01-12 LAB — RESP PANEL BY RT-PCR (FLU A&B, COVID) ARPGX2
Influenza A by PCR: NEGATIVE
Influenza B by PCR: NEGATIVE
SARS Coronavirus 2 by RT PCR: NEGATIVE

## 2020-01-12 LAB — COMPREHENSIVE METABOLIC PANEL
ALT: 26 U/L (ref 0–44)
AST: 60 U/L — ABNORMAL HIGH (ref 15–41)
Albumin: 3.3 g/dL — ABNORMAL LOW (ref 3.5–5.0)
Alkaline Phosphatase: 113 U/L (ref 38–126)
Anion gap: 12 (ref 5–15)
BUN: 39 mg/dL — ABNORMAL HIGH (ref 8–23)
CO2: 32 mmol/L (ref 22–32)
Calcium: 9.2 mg/dL (ref 8.9–10.3)
Chloride: 93 mmol/L — ABNORMAL LOW (ref 98–111)
Creatinine, Ser: 0.83 mg/dL (ref 0.44–1.00)
GFR, Estimated: >60 mL/min (ref >60)
Glucose, Bld: 138 mg/dL — ABNORMAL HIGH (ref 70–99)
Potassium: 3.8 mmol/L (ref 3.5–5.1)
Sodium: 137 mmol/L (ref 135–145)
Total Bilirubin: 1 mg/dL (ref 0.3–1.2)
Total Protein: 6.8 g/dL (ref 6.5–8.1)

## 2020-01-12 LAB — BRAIN NATRIURETIC PEPTIDE: B Natriuretic Peptide: 613.9 pg/mL — ABNORMAL HIGH (ref 0.0–100.0)

## 2020-01-12 LAB — LACTIC ACID, PLASMA: Lactic Acid, Venous: 1.6 mmol/L (ref 0.5–1.9)

## 2020-01-12 LAB — TROPONIN I (HIGH SENSITIVITY): Troponin I (High Sensitivity): 5008 ng/L (ref <18)

## 2020-01-12 MED ORDER — MORPHINE SULFATE (PF) 2 MG/ML IV SOLN
1.0000 mg | INTRAVENOUS | Status: DC | PRN
Start: 2020-01-12 — End: 2020-01-12

## 2020-01-12 MED ORDER — POLYVINYL ALCOHOL 1.4 % OP SOLN
1.0000 [drp] | Freq: Four times a day (QID) | OPHTHALMIC | Status: DC | PRN
  Filled 2020-01-12: qty 15

## 2020-01-12 MED ORDER — ONDANSETRON HCL 4 MG/2ML IJ SOLN: 4.0000 mg | Freq: Four times a day (QID) | INTRAMUSCULAR | Status: DC | PRN

## 2020-01-12 MED ORDER — ACETAMINOPHEN 650 MG RE SUPP: 650.0000 mg | Freq: Four times a day (QID) | RECTAL | Status: DC | PRN

## 2020-01-12 MED ORDER — LACTATED RINGERS IV BOLUS (SEPSIS): 500.0000 mL | Freq: Once | INTRAVENOUS | Status: DC

## 2020-01-12 MED ORDER — HALOPERIDOL LACTATE 2 MG/ML PO CONC
0.5000 mg | ORAL | Status: DC | PRN
  Filled 2020-01-12: qty 0.3

## 2020-01-12 MED ORDER — VANCOMYCIN HCL IN DEXTROSE 1-5 GM/200ML-% IV SOLN: 1000.0000 mg | Freq: Once | INTRAVENOUS | Status: DC

## 2020-01-12 MED ORDER — BIOTENE DRY MOUTH MT LIQD
15.0000 mL | OROMUCOSAL | Status: DC | PRN
  Filled 2020-01-12: qty 15

## 2020-01-12 MED ORDER — LACTATED RINGERS IV SOLN: INTRAVENOUS | Status: DC

## 2020-01-12 MED ORDER — SODIUM CHLORIDE 0.9 % IV SOLN: 250.0000 mL | INTRAVENOUS | Status: DC | PRN

## 2020-01-12 MED ORDER — LORAZEPAM 2 MG/ML PO CONC: 1.0000 mg | ORAL | Status: DC | PRN

## 2020-01-12 MED ORDER — HALOPERIDOL LACTATE 5 MG/ML IJ SOLN: 0.5000 mg | INTRAMUSCULAR | Status: DC | PRN

## 2020-01-12 MED ORDER — MORPHINE SULFATE (PF) 2 MG/ML IV SOLN
2.0000 mg | INTRAVENOUS | Status: DC | PRN
Start: 2020-01-12 — End: 2020-01-12

## 2020-01-12 MED ORDER — ONDANSETRON 4 MG PO TBDP: 4.0000 mg | ORAL_TABLET | Freq: Four times a day (QID) | ORAL | Status: DC | PRN

## 2020-01-12 MED ORDER — LORAZEPAM 2 MG/ML IJ SOLN
0.5000 mg | INTRAMUSCULAR | Status: DC | PRN
  Administered 2020-01-12: 09:00:00 0.5 mg via INTRAVENOUS
  Filled 2020-01-12: qty 1

## 2020-01-12 MED ORDER — GLYCOPYRROLATE 0.2 MG/ML IJ SOLN
0.2000 mg | INTRAMUSCULAR | Status: DC | PRN
  Filled 2020-01-12: qty 1

## 2020-01-12 MED ORDER — GLYCOPYRROLATE 1 MG PO TABS
1.0000 mg | ORAL_TABLET | ORAL | Status: DC | PRN
  Filled 2020-01-12: qty 1

## 2020-01-12 MED ORDER — LORAZEPAM 2 MG/ML IJ SOLN: 1.0000 mg | INTRAMUSCULAR | Status: DC | PRN

## 2020-01-12 MED ORDER — HALOPERIDOL 0.5 MG PO TABS
0.5000 mg | ORAL_TABLET | ORAL | Status: DC | PRN
  Filled 2020-01-12: qty 1

## 2020-01-12 MED ORDER — SODIUM CHLORIDE 0.9 % IV SOLN: 2.0000 g | Freq: Once | INTRAVENOUS | Status: DC

## 2020-01-12 MED ORDER — SODIUM CHLORIDE 0.9% FLUSH: 3.0000 mL | Freq: Two times a day (BID) | INTRAVENOUS | Status: DC

## 2020-01-12 MED ORDER — SODIUM CHLORIDE 0.9% FLUSH
3.0000 mL | INTRAVENOUS | Status: DC | PRN
Start: 2020-01-12 — End: 2020-01-12

## 2020-01-12 MED ORDER — MORPHINE SULFATE (PF) 2 MG/ML IV SOLN
INTRAVENOUS | Status: AC
  Administered 2020-01-12: 08:00:00 2 mg via INTRAVENOUS
  Filled 2020-01-12: qty 1

## 2020-01-12 MED ORDER — LORAZEPAM 1 MG PO TABS: 1.0000 mg | ORAL_TABLET | ORAL | Status: DC | PRN

## 2020-01-12 MED ORDER — ACETAMINOPHEN 325 MG PO TABS: 650.0000 mg | ORAL_TABLET | Freq: Four times a day (QID) | ORAL | Status: DC | PRN

## 2020-01-12 MED ORDER — LACTATED RINGERS IV BOLUS (SEPSIS)
1000.0000 mL | Freq: Once | INTRAVENOUS | Status: AC
  Administered 2020-01-12: 08:00:00 1000 mL via INTRAVENOUS

## 2020-01-12 MED ORDER — SODIUM CHLORIDE 0.9 % IV SOLN: 2.0000 g | INTRAVENOUS | Status: DC

## 2020-01-16 LAB — CULTURE, BLOOD (ROUTINE X 2): Special Requests: ADEQUATE

## 2020-01-17 LAB — CULTURE, BLOOD (ROUTINE X 2)
Culture: NO GROWTH
Special Requests: ADEQUATE

## 2020-01-18 ENCOUNTER — Telehealth: Payer: Medicare HMO

## 2020-01-20 NOTE — H&P (Signed)
History and Physical    Kathy Mata:[id number] DOB: July 25, 1924 DOA: Jan 18, 2020  PCP: [name], DO   Patient coming from: Nursing facility  I have personally briefly reviewed patient's old medical records in [facility]  Chief Complaint: Respiratory distress Most of the history is obtained from ER notes and patient's family at the bedside  HPI: Kathy Mata is a 87 y.o. female with medical history significant for vascular dementia, hypothyroidism, peripheral arterial disease, hypertension, depression and anxiety who was brought in from the skilled nursing facility for evaluation of respiratory distress.  Patient was noted to be hypoxic by nursing home staff with decreased level of consciousness.  Per EMS her pulse oximetry  on 4 L of oxygen was 64%.  At the time of my evaluation patient is on a nonrebreather mask which she keeps pulling off.  I am unable to do a review of systems on this patient due to her mental status. Labs reviewed showed sodium 137, potassium 3.8, chloride 93, bicarb 32, glucose 138, BUN 39, creatinine 0.83, calcium 9.2, alkaline phosphatase 113, albumin 3.3, AST 60, ALT 26, total protein 6.8, BNP 613, troponin 5000, lactic acid 1.6, white count 25.7, hemoglobin 14.1, hematocrit 43.1, MCV 92.3, RDW 13.6, platelet count 335 Respiratory viral panel is negative Chest x-ray reviewed by me shows right lower lobe infiltrate versus atelectasis.  Hyperinflated lungs. Twelve-lead EKG shows sinus rhythm, LVH, nonspecific T wave changes in the anterior lateral leads   ED Course: Patient is a 87 year old Caucasian female who was brought to the emergency room from the skilled nursing facility for evaluation of respiratory distress and decreased level of consciousness.  Patient is noted to have severe sepsis with shock as well as acute respiratory failure.  Her family at the bedside do not want any heroic measures and have opted to keep patient  comfortable.  Review of Systems: As per HPI otherwise all systems reviewed and negative.    Past Medical History:  Diagnosis Date  . Hypertension   . Memory deficit     Past Surgical History:  Procedure Laterality Date  . CATARACT EXTRACTION, BILATERAL    . INTRAMEDULLARY (IM) NAIL INTERTROCHANTERIC Left 10/13/2019   Procedure: INTRAMEDULLARY (IM) NAIL INTERTROCHANTRIC;  Surgeon: Kennedy Bucker, MD;  Location: [facility] ORS;  Service: Orthopedics;  Laterality: Left;  . THYROIDECTOMY    . TONSILLECTOMY       reports that she has never smoked. She has never used smokeless tobacco. She reports that she does not drink alcohol and does not use drugs.  No Known Allergies  History reviewed. No pertinent family history.   Prior to Admission medications   Medication Sig Start Date End Date Taking? Authorizing Provider  donepezil (ARICEPT) 10 MG tablet Take 1 tablet (10 mg total) by mouth at bedtime. 09/11/19   Karamalegos, Netta Neat, DO  enoxaparin (LOVENOX) 30 MG/0.3ML injection Inject 0.3 mLs (30 mg total) into the skin daily. 10/17/19   Evon Slack, PA-C  escitalopram (LEXAPRO) 10 MG tablet Take 5 mg by mouth every other day.    [provider]  ferrous fumarate-b12-vitamic C-folic acid (TRINSICON / FOLTRIN) capsule Take 1 capsule by mouth 2 (two) times daily after a meal. 10/18/19   Evon Slack, PA-C  HYDROcodone-acetaminophen (NORCO/VICODIN) 5-325 MG tablet Take 1-2 tablets by mouth every 4 (four) hours as needed for moderate pain (pain score 4-6). 10/16/19   Evon Slack, PA-C  levothyroxine (SYNTHROID) 75 MCG tablet Take 1 tablet (75 mcg total)  by mouth daily. 06/28/19   Karamalegos, Netta Neat, DO  metoprolol succinate (TOPROL-XL) 25 MG 24 hr tablet Take 1 tablet (25 mg total) by mouth daily. 06/28/19   Karamalegos, Netta Neat, DO  traZODone (DESYREL) 50 MG tablet Take 1 tablet (50 mg total) by mouth at bedtime. 09/11/19   [name], DO    Physical  Exam: Vitals:   01-14-2020 0652 14-Jan-2020 0745 01/14/2020 0800 January 14, 2020 0830  BP: (!) 103/57 (!) 77/45 (!) 145/54   Pulse: (!) 123 (!) 105 96 (!) 109  Resp: (!) 23 (!) 32 (!) 23 (!) 22  Temp: 97.6 F (36.4 C)     TempSrc: Axillary     SpO2: 100% 97% 100% 100%  Weight:      Height:         Vitals:   01/14/20 0652 Jan 14, 2020 0745 2020-01-14 0800 01/14/20 0830  BP: (!) 103/57 (!) 77/45 (!) 145/54   Pulse: (!) 123 (!) 105 96 (!) 109  Resp: (!) 23 (!) 32 (!) 23 (!) 22  Temp: 97.6 F (36.4 C)     TempSrc: Axillary     SpO2: 100% 97% 100% 100%  Weight:      Height:        Constitutional: NAD, alert but does not respond to verbal stimuli.  Appears frail.  Ecchymosis over the right forehead following a fall Eyes: PERRL, lids and conjunctivae normal ENMT: Mucous membranes are dry with crusted blood in the mouth Neck: normal, supple, no masses, no thyromegaly Respiratory: right lower lobe rhonchi, no wheezing, no crackles.  Tachypneic Cardiovascular: Tachycardic, no murmurs / rubs / gallops. No extremity edema. 2+ pedal pulses. No carotid bruits.  Abdomen: no tenderness, no masses palpated. No hepatosplenomegaly. Bowel sounds positive.  Scaphoid Musculoskeletal: no clubbing / cyanosis. No joint deformity upper and lower extremities.  Skin: no rashes, lesions, ulcers.  Neurologic: No gross focal neurologic deficit.  Unable to assess patient not following any command Psychiatric: Unable to assess   Labs on Admission: I have personally reviewed following labs and imaging studies  CBC: Recent Labs  Lab 01-14-20 0656  WBC 25.7*  NEUTROABS 21.2*  HGB 14.1  HCT 43.1  MCV 92.3  PLT 335   Basic Metabolic Panel: Recent Labs  Lab Jan 14, 2020 0656  NA 137  K 3.8  CL 93*  CO2 32  GLUCOSE 138*  BUN 39*  CREATININE 0.83  CALCIUM 9.2   GFR: Estimated Creatinine Clearance: 28.8 mL/min (by C-G formula based on SCr of 0.83 mg/dL). Liver Function Tests: Recent Labs  Lab 2020-01-14 0656   AST 60*  ALT 26  ALKPHOS 113  BILITOT 1.0  PROT 6.8  ALBUMIN 3.3*   No results for input(s): LIPASE, AMYLASE in the last 168 hours. No results for input(s): AMMONIA in the last 168 hours. Coagulation Profile: No results for input(s): INR, PROTIME in the last 168 hours. Cardiac Enzymes: No results for input(s): CKTOTAL, CKMB, CKMBINDEX, TROPONINI in the last 168 hours. BNP (last 3 results) No results for input(s): PROBNP in the last 8760 hours. HbA1C: No results for input(s): HGBA1C in the last 72 hours. CBG: No results for input(s): GLUCAP in the last 168 hours. Lipid Profile: No results for input(s): CHOL, HDL, LDLCALC, TRIG, CHOLHDL, LDLDIRECT in the last 72 hours. Thyroid Function Tests: No results for input(s): TSH, T4TOTAL, FREET4, T3FREE, THYROIDAB in the last 72 hours. Anemia Panel: No results for input(s): VITAMINB12, FOLATE, FERRITIN, TIBC, IRON, RETICCTPCT in the last 72 hours. Urine  analysis: No results found for: COLORURINE, APPEARANCEUR, LABSPEC, PHURINE, GLUCOSEU, HGBUR, BILIRUBINUR, KETONESUR, PROTEINUR, UROBILINOGEN, NITRITE, LEUKOCYTESUR  Radiological Exams on Admission: DG Chest Port 1 View  Result Date: 01-31-2020 CLINICAL DATA:  Sepsis EXAM: PORTABLE CHEST 1 VIEW COMPARISON:  10/13/2019 FINDINGS: Normal cardiac silhouette. Lungs are hyperinflated. No pneumothorax. Increased density in RIGHT lung base compared to prior. No acute osseous abnormality. IMPRESSION: RIGHT lower lobe atelectasis versus infiltrate. Hyperinflated lungs Electronically Signed   By: Genevive Bi M.D.   On: 01/31/2020 07:15    EKG: Independently reviewed.  Sinus rhythm, LVH Nonspecific T wave changes in anterolateral leads  Assessment/Plan Principal Problem:   Acute respiratory failure (HCC) Active Problems:   Severe sepsis with septic shock (HCC)   Lobar pneumonia (HCC)    Patient presents to the emergency room for evaluation of respiratory distress and is found to be  septic from right lower lobe pneumonia concerning for possible aspiration. Patient is tachycardic, tachypneic, hypotensive, has marked leukocytosis and chest x-ray findings of right lower lobe pneumonia. She is also hypoxic requiring a nonrebreather mask Family (has son and daughter-in-law at the bedside) do not want any aggressive measures I want patient placed on comfort measures  DVT prophylaxis: None Code Status: DNR Family Communication: Greater than 50% of time was spent discussing patient's condition, poor prognosis and plan of care with patient's son and daughter-in-law who are her healthcare power of attorney at the bedside.  They are in agreement for patient to be placed on comfort measures and do not want to prolong her suffering. Disposition Plan: Back to previous home environment Consults called: Palliative care    Unika Nazareno MD [facility]     2020/01/31, 8:52 AM

## 2020-01-20 NOTE — ED Notes (Addendum)
Charge RN and MD given ok for pt to have 4 visitors at a time. MD stating pt DNR and comfort care

## 2020-01-20 NOTE — ED Notes (Signed)
Admitting MD at bedside.

## 2020-01-20 NOTE — ED Provider Notes (Signed)
[facility] Emergency Department Provider Note   ____________________________________________   Event Date/Time   First MD Initiated Contact with Patient Jan 18, 2020 [phone]     (approximate)  I have reviewed the triage vital signs and the nursing notes.   HISTORY  Chief Complaint Respiratory distress  Level V caveat: Limited by distress  HPI Kathy Mata is a 87 y.o. female brought to the ED via EMS from SNF with a chief complaint of respiratory distress.  Staff reports patient with decreased LOC and hypoxia.  Staff placed 4 L nasal cannula on patient.  EMS reports sats on 4 L nasal cannula 64% upon their arrival.  Patient presents on nonrebreather with saturations 99%.  Patient able to answer yes or no questions.  Rest of history is limited secondary to distress.  SNF reported to EMS that patient is supposed to be a DNR but they could not find her paperwork.     Past Medical History:  Diagnosis Date  . Hypertension   . Memory deficit     Patient Active Problem List   Diagnosis Date Noted  . Open femur fracture, left (HCC) 10/13/2019  . DNR (do not resuscitate) 09/18/2019  . Depression with anxiety 09/11/2019  . Aortic atherosclerosis (HCC) 07/16/2019  . PAD (peripheral artery disease) (HCC) 07/06/2019  . Bilateral carotid artery stenosis 07/06/2019  . Acquired hypothyroidism 06/22/2019  . Essential hypertension 06/22/2019  . Vascular dementia with behavior disturbance (HCC) 06/22/2019    Past Surgical History:  Procedure Laterality Date  . CATARACT EXTRACTION, BILATERAL    . INTRAMEDULLARY (IM) NAIL INTERTROCHANTERIC Left 10/13/2019   Procedure: INTRAMEDULLARY (IM) NAIL INTERTROCHANTRIC;  Surgeon: Kennedy Bucker, MD;  Location: [facility] ORS;  Service: Orthopedics;  Laterality: Left;  . THYROIDECTOMY    . TONSILLECTOMY      Prior to Admission medications   Medication Sig Start Date End Date Taking? Authorizing Provider  donepezil (ARICEPT) 10 MG  tablet Take 1 tablet (10 mg total) by mouth at bedtime. 09/11/19   Karamalegos, Netta Neat, DO  enoxaparin (LOVENOX) 30 MG/0.3ML injection Inject 0.3 mLs (30 mg total) into the skin daily. 10/17/19   Evon Slack, PA-C  escitalopram (LEXAPRO) 10 MG tablet Take 5 mg by mouth every other day.    [provider]  ferrous fumarate-b12-vitamic C-folic acid (TRINSICON / FOLTRIN) capsule Take 1 capsule by mouth 2 (two) times daily after a meal. 10/18/19   Evon Slack, PA-C  HYDROcodone-acetaminophen (NORCO/VICODIN) 5-325 MG tablet Take 1-2 tablets by mouth every 4 (four) hours as needed for moderate pain (pain score 4-6). 10/16/19   Evon Slack, PA-C  levothyroxine (SYNTHROID) 75 MCG tablet Take 1 tablet (75 mcg total) by mouth daily. 06/28/19   Karamalegos, Netta Neat, DO  metoprolol succinate (TOPROL-XL) 25 MG 24 hr tablet Take 1 tablet (25 mg total) by mouth daily. 06/28/19   Karamalegos, Netta Neat, DO  traZODone (DESYREL) 50 MG tablet Take 1 tablet (50 mg total) by mouth at bedtime. 09/11/19   [name], DO    Allergies Patient has no known allergies.  History reviewed. No pertinent family history.  Social History Social History   Tobacco Use  . Smoking status: Never Smoker  . Smokeless tobacco: Never Used  Substance Use Topics  . Alcohol use: Never  . Drug use: Never    Review of Systems  Constitutional: No fever/chills Eyes: No visual changes. ENT: No sore throat. Cardiovascular: Denies chest pain. Respiratory: Positive for shortness of breath.  Gastrointestinal: No abdominal pain.  No nausea, no vomiting.  No diarrhea.  No constipation. Genitourinary: Negative for dysuria. Musculoskeletal: Negative for back pain. Skin: Negative for rash. Neurological: Negative for headaches, focal weakness or numbness.   ____________________________________________   PHYSICAL EXAM:  VITAL SIGNS: ED Triage Vitals  Enc Vitals Group     BP --      Pulse --       Resp --      Temp --      Temp src --      SpO2 2020-01-25 0649 100 %     Weight 2020-01-25 0650 95 lb (43.1 kg)     Height 2020/01/25 0650 5\' 5"  (1.651 m)     Head Circumference --      [place] Flow --      Pain Score --      Pain Loc --      Pain Edu? --      Excl. in GC? --     Constitutional: Alert, answers simple questions.  In moderate to severe distress.   Eyes: Conjunctivae are normal. PERRL. EOMI. Head: Atraumatic. Nose: No congestion/rhinnorhea. Mouth/Throat: Mucous membranes are dry.   Neck: No stridor.   Cardiovascular: Tachycardic rate, regular rhythm. Grossly normal heart sounds.  Good peripheral circulation. Respiratory: Increased respiratory effort.  Retractions. Lungs with rales and rhonchi. Gastrointestinal: Soft and nontender. No distention. No abdominal bruits. No CVA tenderness. Musculoskeletal: No lower extremity tenderness nor edema.  No joint effusions. Neurologic: Decreased LOC but eyes open spontaneously and able to answer yes/no questions.  Normal speech and language. No gross focal neurologic deficits are appreciated.  Skin:  Skin is warm, dry and intact. No rash noted.  No petechiae. Psychiatric: Mood and affect are normal. Speech and behavior are normal.  ____________________________________________   LABS (all labs ordered are listed, but only abnormal results are displayed)  Labs Reviewed  URINE CULTURE  CULTURE, BLOOD (ROUTINE X 2)  CULTURE, BLOOD (ROUTINE X 2)  RESP PANEL BY RT-PCR (FLU A&B, COVID) ARPGX2  LACTIC ACID, PLASMA  LACTIC ACID, PLASMA  COMPREHENSIVE METABOLIC PANEL  CBC WITH DIFFERENTIAL/PLATELET  PROTIME-INR  APTT  URINALYSIS, COMPLETE (UACMP) WITH MICROSCOPIC  BRAIN NATRIURETIC PEPTIDE  TROPONIN I (HIGH SENSITIVITY)   ____________________________________________  EKG  ED ECG REPORT I, Anjana Cheek J, the attending physician, personally viewed and interpreted this ECG.   Date: 01-25-2020  EKG Time: 0657  Rate: 110   Rhythm: sinus tachycardia  Axis: Normal  Intervals:none  ST&T Change: Nonspecific  ____________________________________________  RADIOLOGY I, Jim Philemon J, personally viewed and evaluated these images (plain radiographs) as part of my medical decision making, as well as reviewing the written report by the radiologist.  ED MD interpretation:  Pending  Official radiology report(s): No results found.  ____________________________________________   PROCEDURES  Procedure(s) performed (including Critical Care):  .1-3 Lead EKG Interpretation Performed by: [id number], MD Authorized by: Irean Hong, MD     Interpretation: abnormal     ECG rate:  110   ECG rate assessment: tachycardic     Rhythm: sinus tachycardia     Ectopy: none     Conduction: normal   Comments:     Patient placed on cardiac monitor to evaluate for arrhythmias   CRITICAL CARE Performed by: Irean Hong   Total critical care time: 15 minutes  Critical care time was exclusive of separately billable procedures and treating other patients.  Critical care was necessary to treat or prevent imminent or  life-threatening deterioration.  Critical care was time spent personally by me on the following activities: development of treatment plan with patient and/or surrogate as well as nursing, discussions with consultants, evaluation of patient's response to treatment, examination of patient, obtaining history from patient or surrogate, ordering and performing treatments and interventions, ordering and review of laboratory studies, ordering and review of radiographic studies, pulse oximetry and re-evaluation of patient's condition.   ____________________________________________   INITIAL IMPRESSION / ASSESSMENT AND PLAN / ED COURSE  As part of my medical decision making, I reviewed the following data within the electronic MEDICAL RECORD NUMBER Nursing notes reviewed and incorporated, Labs reviewed, EKG interpreted, Old  chart reviewed, Patient signed out to, Radiograph reviewed and Notes from prior ED visits     87 year old female presenting from SNF in respiratory distress Differential includes, but is not limited to, viral syndrome, bronchitis including COPD exacerbation, pneumonia, reactive airway disease including asthma, CHF including exacerbation with or without pulmonary/interstitial edema, pneumothorax, ACS, thoracic trauma, and pulmonary embolism.  EMS reports patient supposed to be DNR but SNF did not have her paperwork.  We will need to confirm this with family when they can be contacted.  She was switched to BiPAP upon her arrival to the treatment room.  Breathing more comfortably.  Concern for aspiration.  Will obtain sepsis work-up including chest x-ray.   Clinical Course as of Jan 19, 2020 0703  Fri 01-19-2020  0701 Care transferred to Dr. Fanny Bien at change of shift pending all labs, cxr. [JS]    Clinical Course User Index [JS] Irean Hong, MD     ____________________________________________   FINAL CLINICAL IMPRESSION(S) / ED DIAGNOSES  Final diagnoses:  Respiratory distress  Hypoxia     ED Discharge Orders    None      *Please note:  Tuyet Bader was evaluated in Emergency Department on 01/19/20 for the symptoms described in the history of present illness. She was evaluated in the context of the global COVID-19 pandemic, which necessitated consideration that the patient might be at risk for infection with the SARS-CoV-2 virus that causes COVID-19. Institutional protocols and algorithms that pertain to the evaluation of patients at risk for COVID-19 are in a state of rapid change based on information released by regulatory bodies including the CDC and federal and state organizations. These policies and algorithms were followed during the patient's care in the ED.  Some ED evaluations and interventions may be delayed as a result of limited staffing during and the pandemic.*   Note:   This document was prepared using Dragon voice recognition software and may include unintentional dictation errors.   Irean Hong, MD 01/19/20 [phone]

## 2020-01-20 NOTE — ED Notes (Signed)
Pt's socks placed in pt specific bag. Pt placed in morgue bag. Clothes remain on pt at this time.

## 2020-01-20 NOTE — ED Notes (Signed)
TOD 0930

## 2020-01-20 NOTE — ED Provider Notes (Signed)
Patient is now hypotensive blood pressure in the mid 70s systolic.  She appears with labored breathing, taken off BiPAP.  She maintains oxygen saturation of about 95% on nonrebreather, however appears with moderately labored breathing.  Does not appear acutely uncomfortable except for potentially some air hunger, given 2 mg of morphine with some improvement  Her family is at the bedside including her son, Moise Boring, as well as Mrs. Brooke Dare who identifies healthcare power of attorney.  Now at this point they wish for her to not receive any further life-sustaining treatments such as even antibiotics.  They understand and they report that it would be the patient strong wish to be allowed to pass and is much comfort as possible.  We discussed I do not have the timing of when this could happen, but given the request I will admit to the hospital for further care and management and palliative consult.  We will discontinue code sepsis, we will allow for fluids, anxiolysis, treatment for air hunger oxygen, but no antibiotics and no further diagnostic work-up or testing per the patient's desires given through her family and healthcare power of attorney     Sharyn Creamer, MD Jan 20, 2020 [phone]

## 2020-01-20 NOTE — ED Notes (Addendum)
Pt HR in 20-30s. Family at bedside. MD made aware.

## 2020-01-20 NOTE — ED Provider Notes (Signed)
Patient's daughter-in-law Mrs. Brooke Dare is at bedside now.  She advises that she is patient's healthcare power of attorney, and primary goal the patient is to keep her comfortable.  She is very clear that the patient's wishes are to be DNR and she would not wish to have any heroic measures.  No intubation no CPR and if it appears that she is at end-of-life to be made comfortable.  We will honor these wishes  Additional family members per Mrs. Brooke Dare are here and would like to be able to see the patient, will allow visitors due to concerns around the patient's critical status  Patient is resting on BiPAP at this time, she is tolerating this comfortably.  She does not appear in distress or pain.   Sharyn Creamer, MD 01/31/20 0730

## 2020-01-20 NOTE — ED Triage Notes (Signed)
Pt normally alert and on RA. Pt 64% on 4 L [place] [facility] per EMS report. Pt reportedly decreased mental status and presents on 15L NRB. No meds administered en route. Pt alert but lethargic and with noted wheezing and difficulty breathing. Bipap applied to pt upon pt arrival to room.

## 2020-01-20 NOTE — ED Notes (Signed)
Pt switched to [place] per admitting MD request. Pt family at bedside

## 2020-01-20 NOTE — ED Notes (Signed)
Date and time results received: 01-24-20 0745AM (use smartphrase ".now" to insert current time)  Test: Troponin Critical Value: 5,008  Name of Provider Notified: Dr. Fanny Bien   Orders Received? Or Actions Taken?: No new orders at this time

## 2020-01-20 NOTE — ED Notes (Signed)
Chaplain paged for pt's family.

## 2020-01-20 NOTE — ED Notes (Signed)
Pt placed on NRB per MD request and family request to make pt more comfortable

## 2020-01-20 DEATH — deceased

## 2021-07-13 IMAGING — CT CT CERVICAL SPINE W/O CM
3 of 4 series · 13 of 33 positions shown, 16 images · non-contrast
Comparison: None.

CLINICAL DATA: Fall

EXAM:
CT CERVICAL SPINE WITHOUT CONTRAST
TECHNIQUE: Multidetector CT imaging of the cervical spine was performed without
intravenous contrast. Multiplanar CT image reconstructions were also
generated.

[Series 4: sagittal bone · sagittal · 0.24mm/px · 5 of 62 slices shown, 6 images]
[im 21/62  bone]
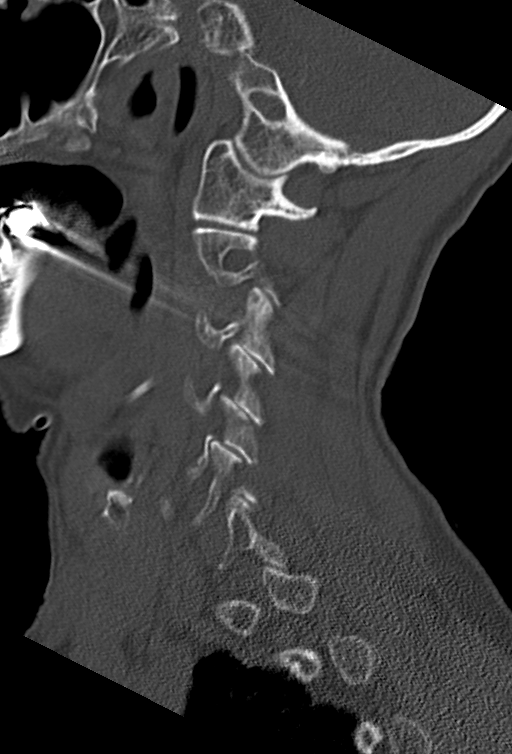
[im 26/62  bone]
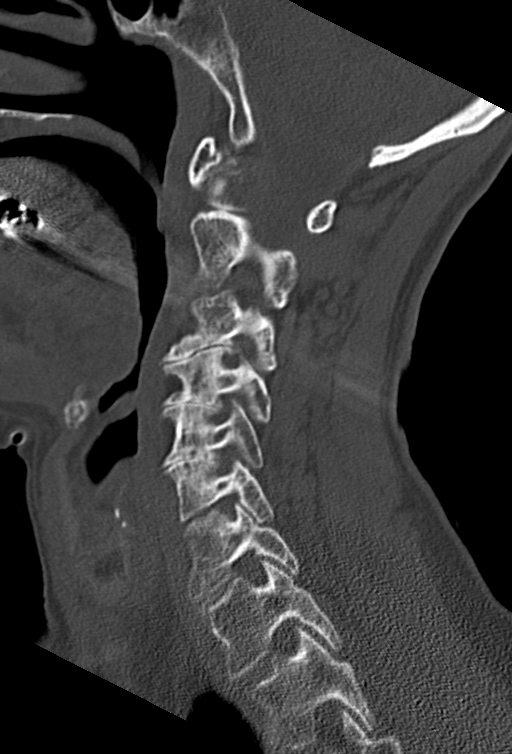
[im 31/62  soft-tissue]
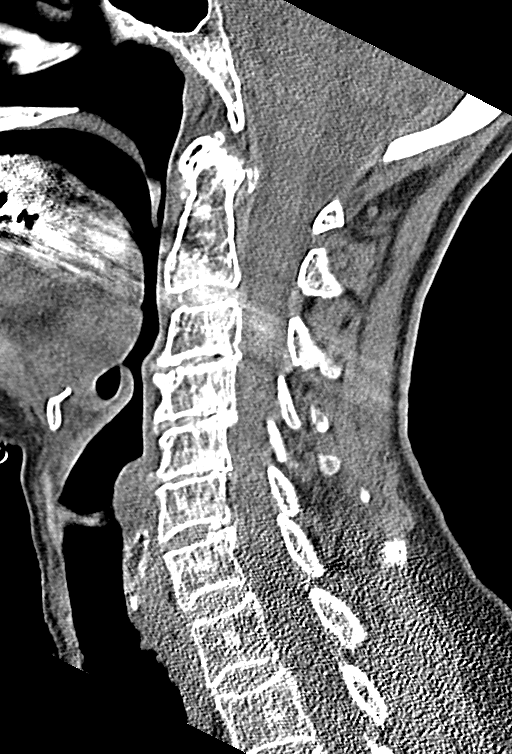
[im 31/62  bone]
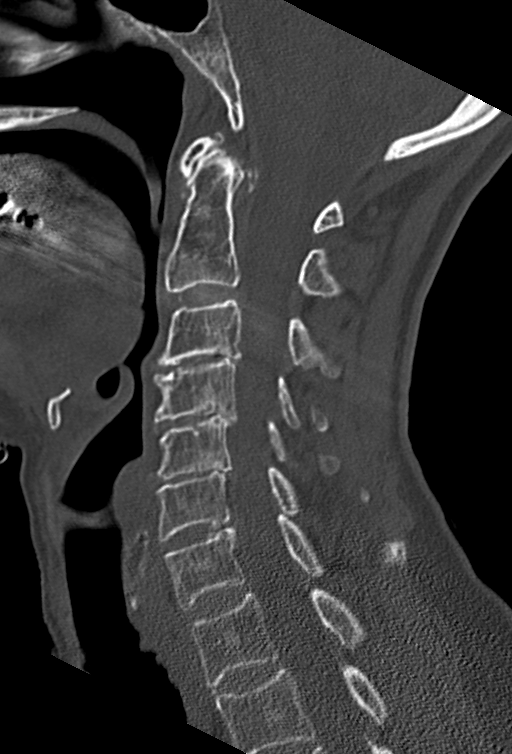
[im 36/62  bone]
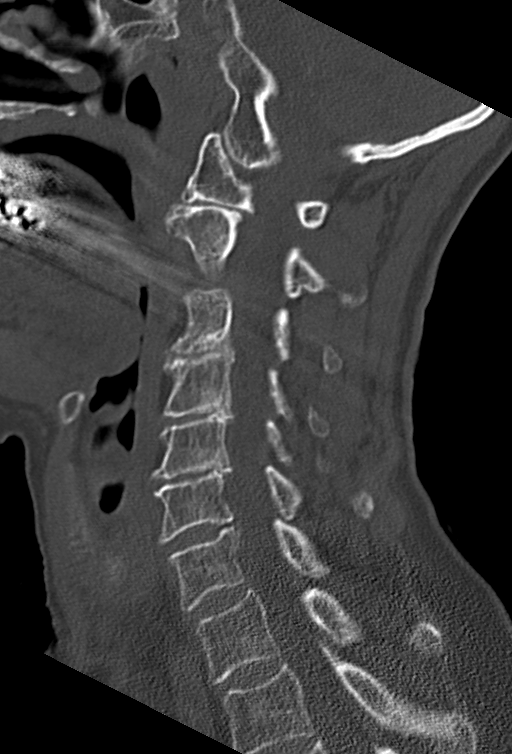
[im 41/62  bone]
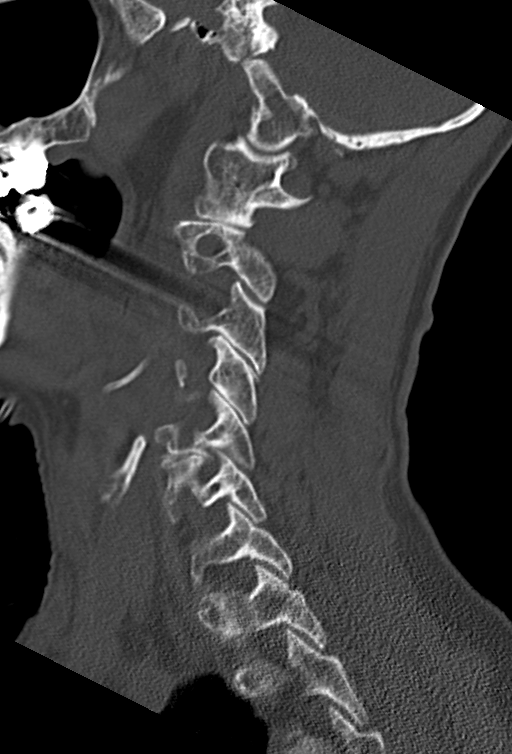

[Series 5: coronal bone · coronal · 0.24mm/px · 3 of 63 slices shown]
[im 15/63  bone]
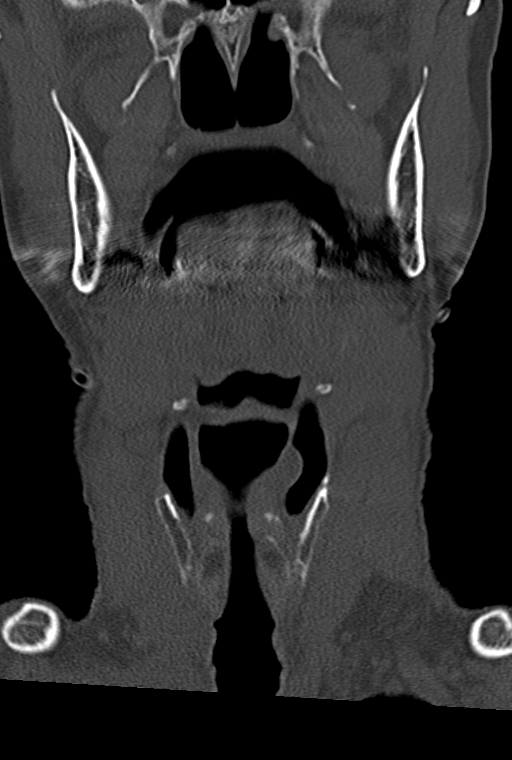
[im 26/63  bone]
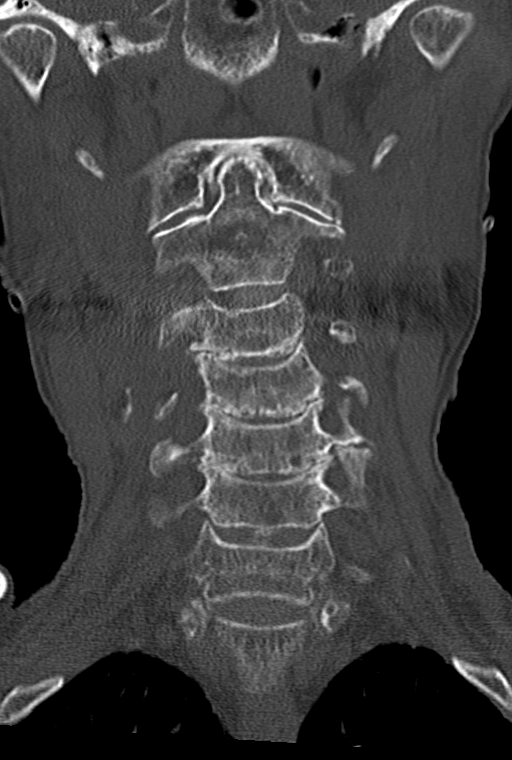
[im 37/63  bone]
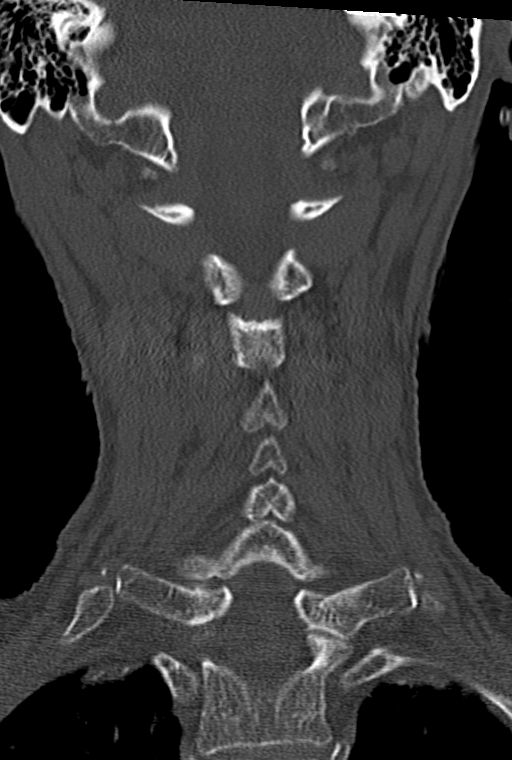

[Series 6: orthogonal bone · axial · 0.24mm/px · z∈[-287,-178]mm · 5 of 93 slices shown, 7 images]
[im 16/93  soft-tissue]
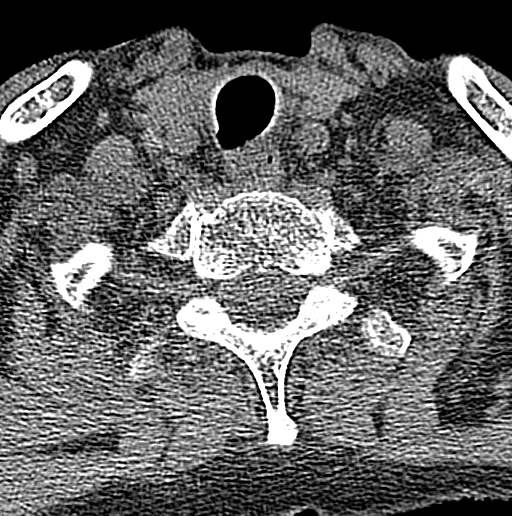
[im 16/93  bone]
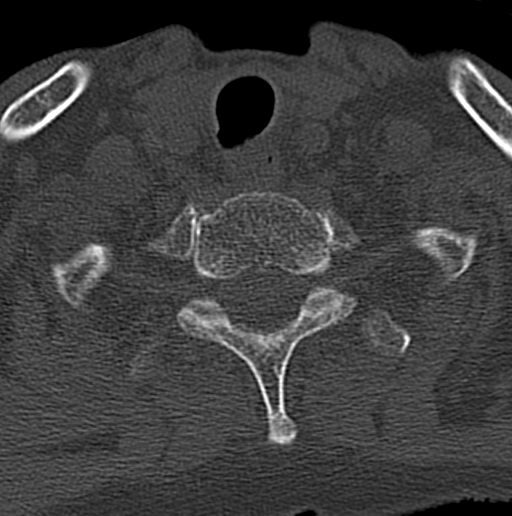
[im 31/93  bone]
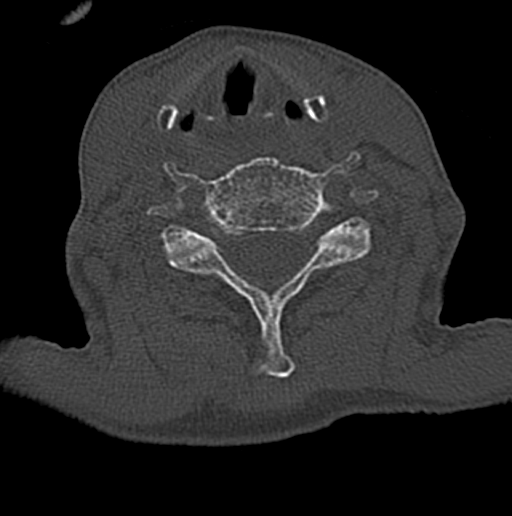
[im 47/93  bone]
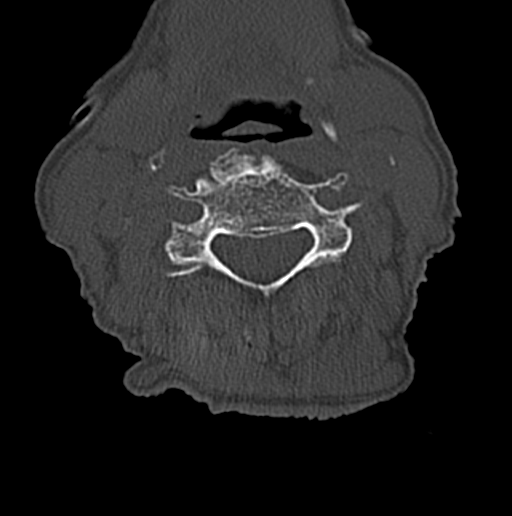
[im 62/93  bone]
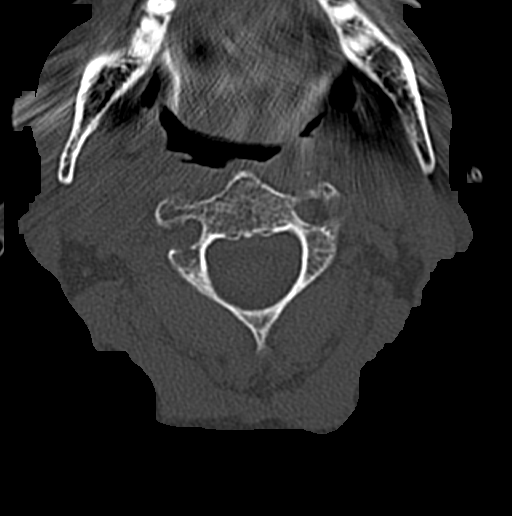
[im 77/93  soft-tissue]
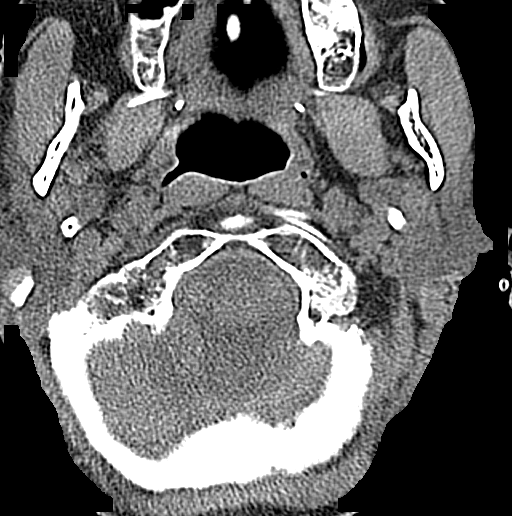
[im 77/93  bone]
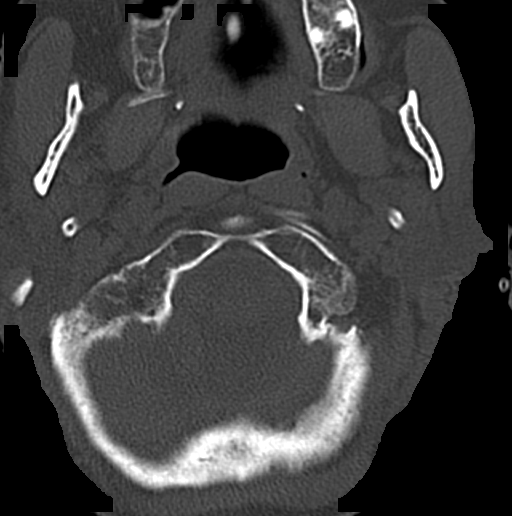

[13 of 33 positions shown; findings below may reference images not displayed]

FINDINGS: Alignment: Normal

Skull base and vertebrae: No acute fracture. No primary bone lesion
or focal pathologic process.

Soft tissues and spinal canal: No prevertebral fluid or swelling. No
visible canal hematoma.

Disc levels:  Diffuse degenerative disc and facet disease.

Upper chest: Biapical scarring.

Other: None
IMPRESSION: Cervical spondylosis.  No acute bony abnormality.

## 2021-07-18 IMAGING — CR DG SHOULDER 2+V PORT*R*
3 series · 3 of 3 positions shown · non-contrast
Comparison: None.

CLINICAL DATA: Right shoulder pain after fall.

EXAM:
PORTABLE RIGHT SHOULDER

[shoulder ap neutral]
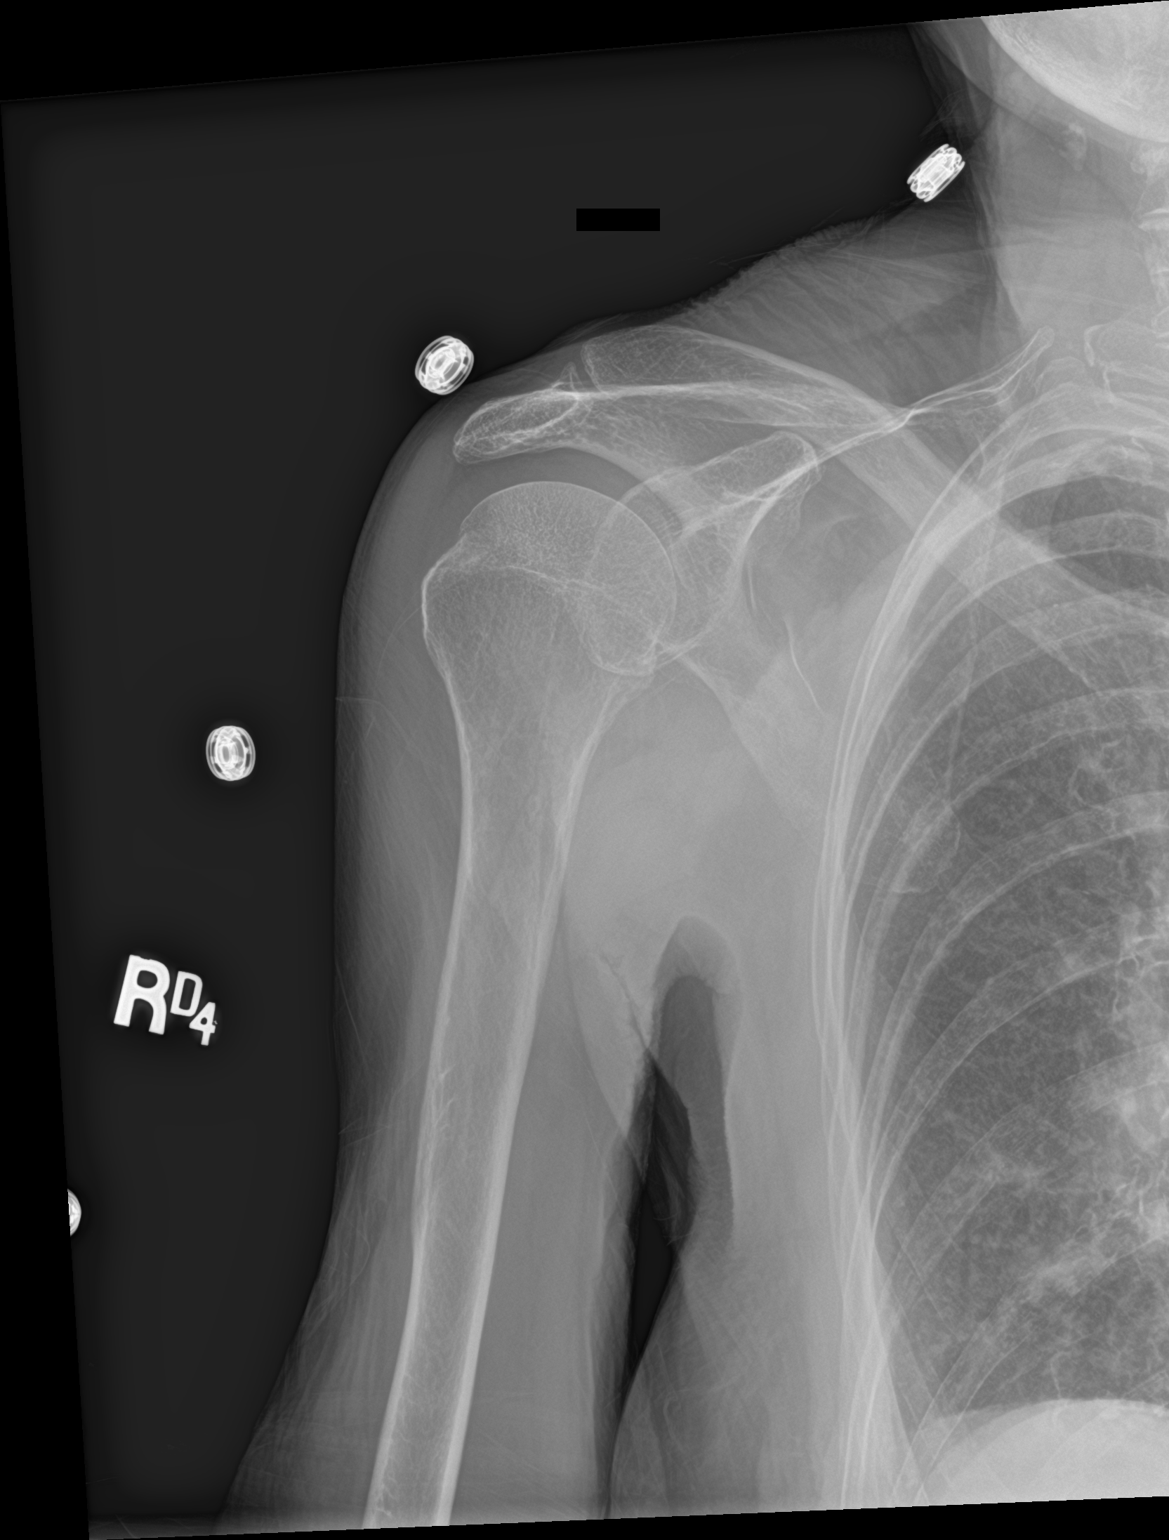

[shoulder grashey]
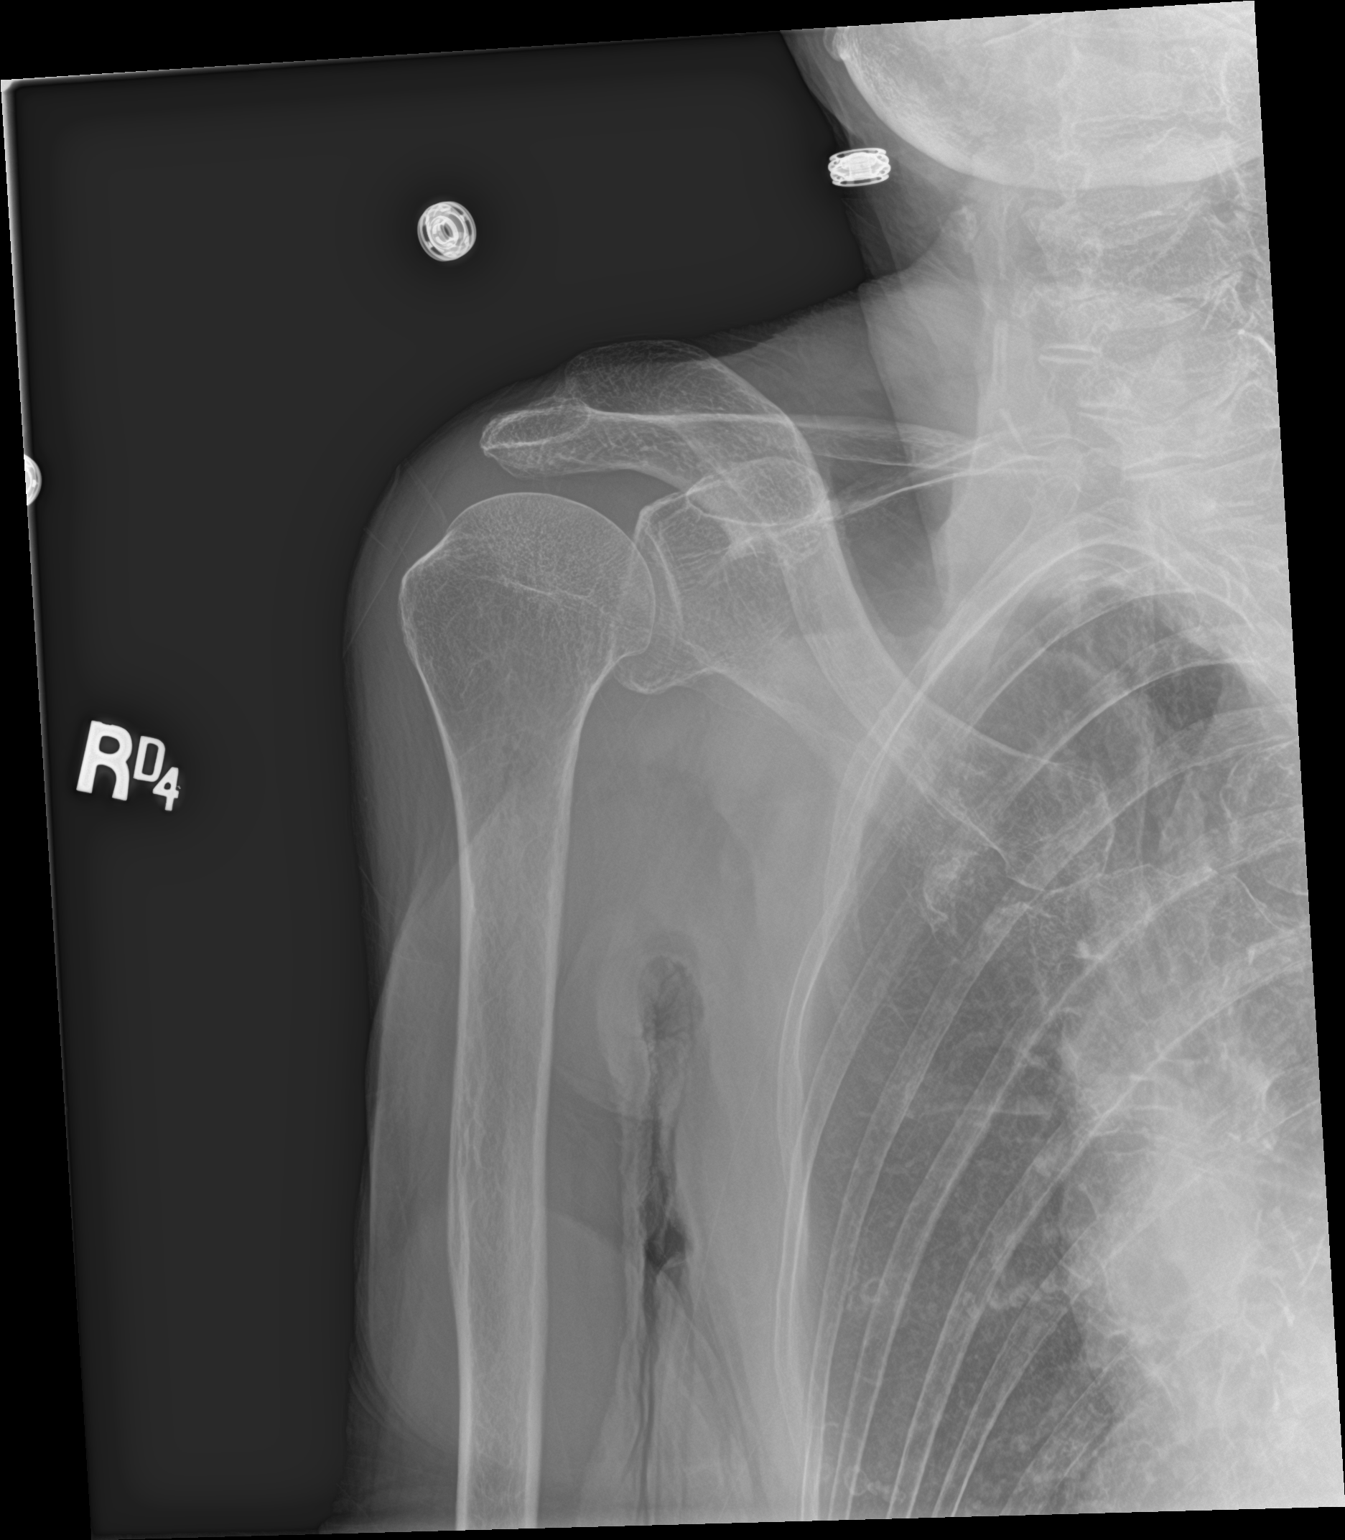

[shoulder y view]
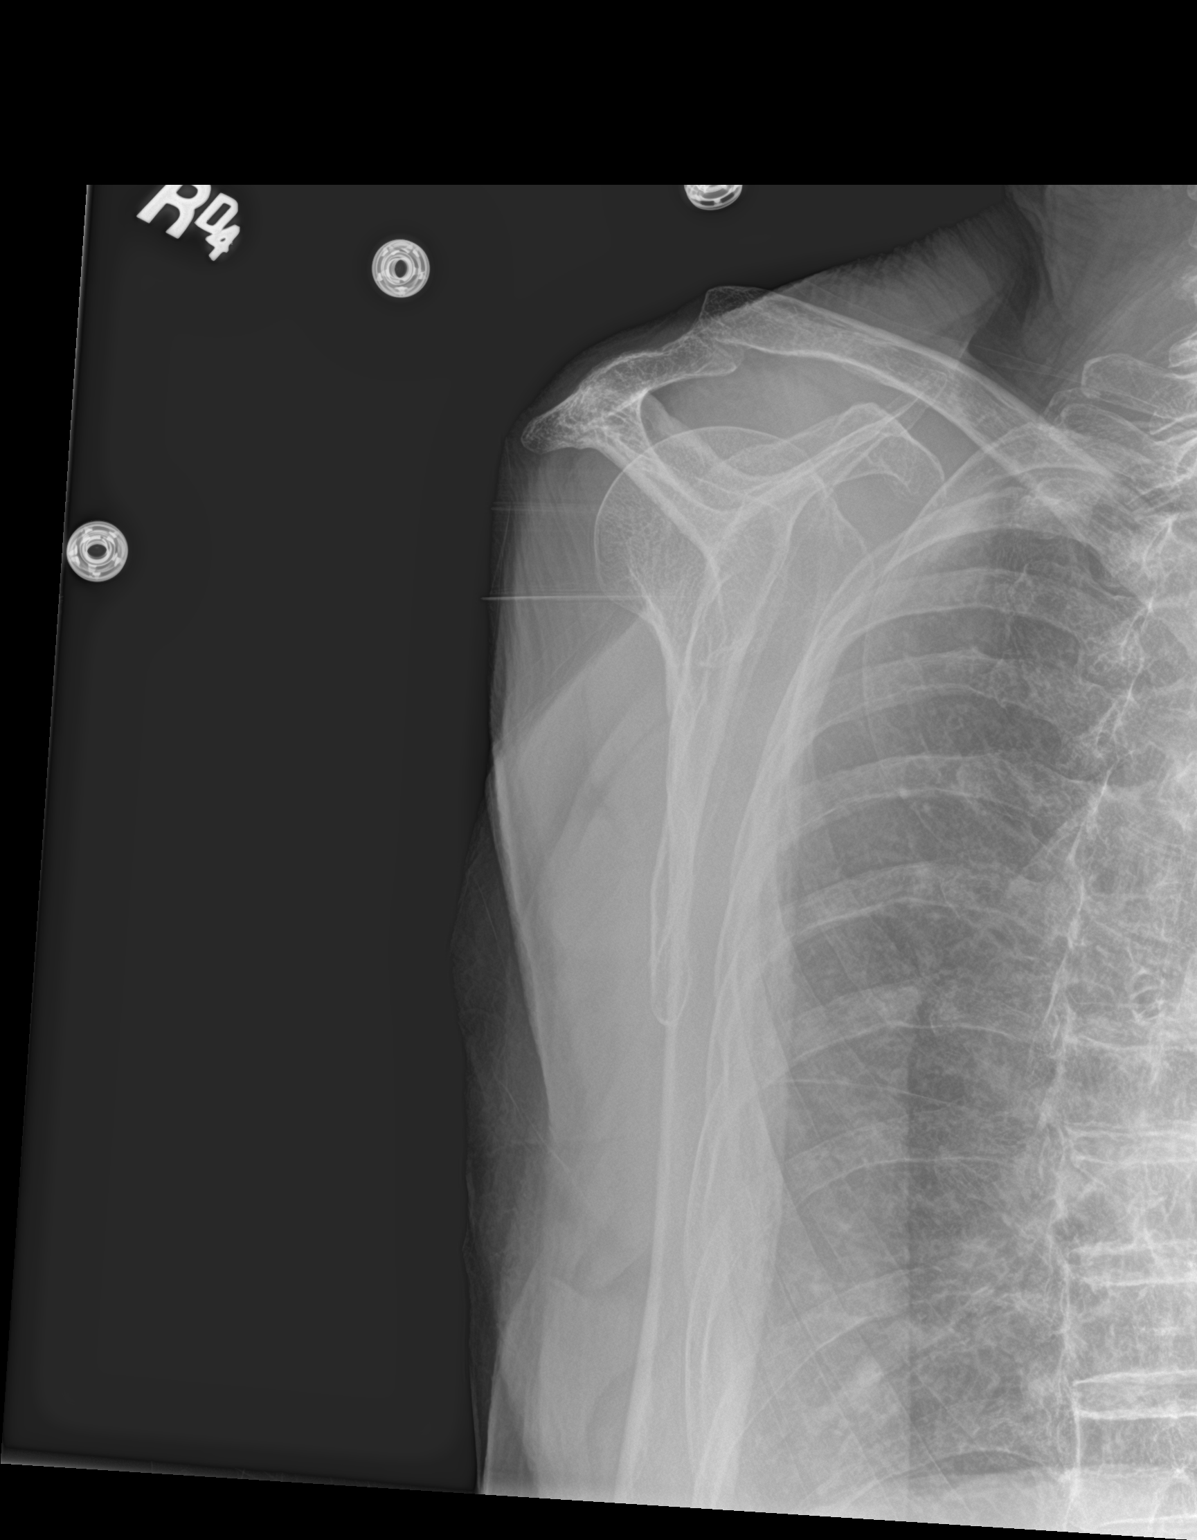

[3 of 3 positions shown; findings below may reference images not displayed]

FINDINGS: There is no evidence of fracture or dislocation. There is no
evidence of arthropathy or other focal bone abnormality. Soft
tissues are unremarkable.
IMPRESSION: Negative.

## 2021-10-12 IMAGING — DX DG CHEST 1V PORT
1 series · 1 of 1 positions shown · non-contrast
Comparison: 10/13/2019

CLINICAL DATA: Sepsis

EXAM:
PORTABLE CHEST 1 VIEW

[chest ap]
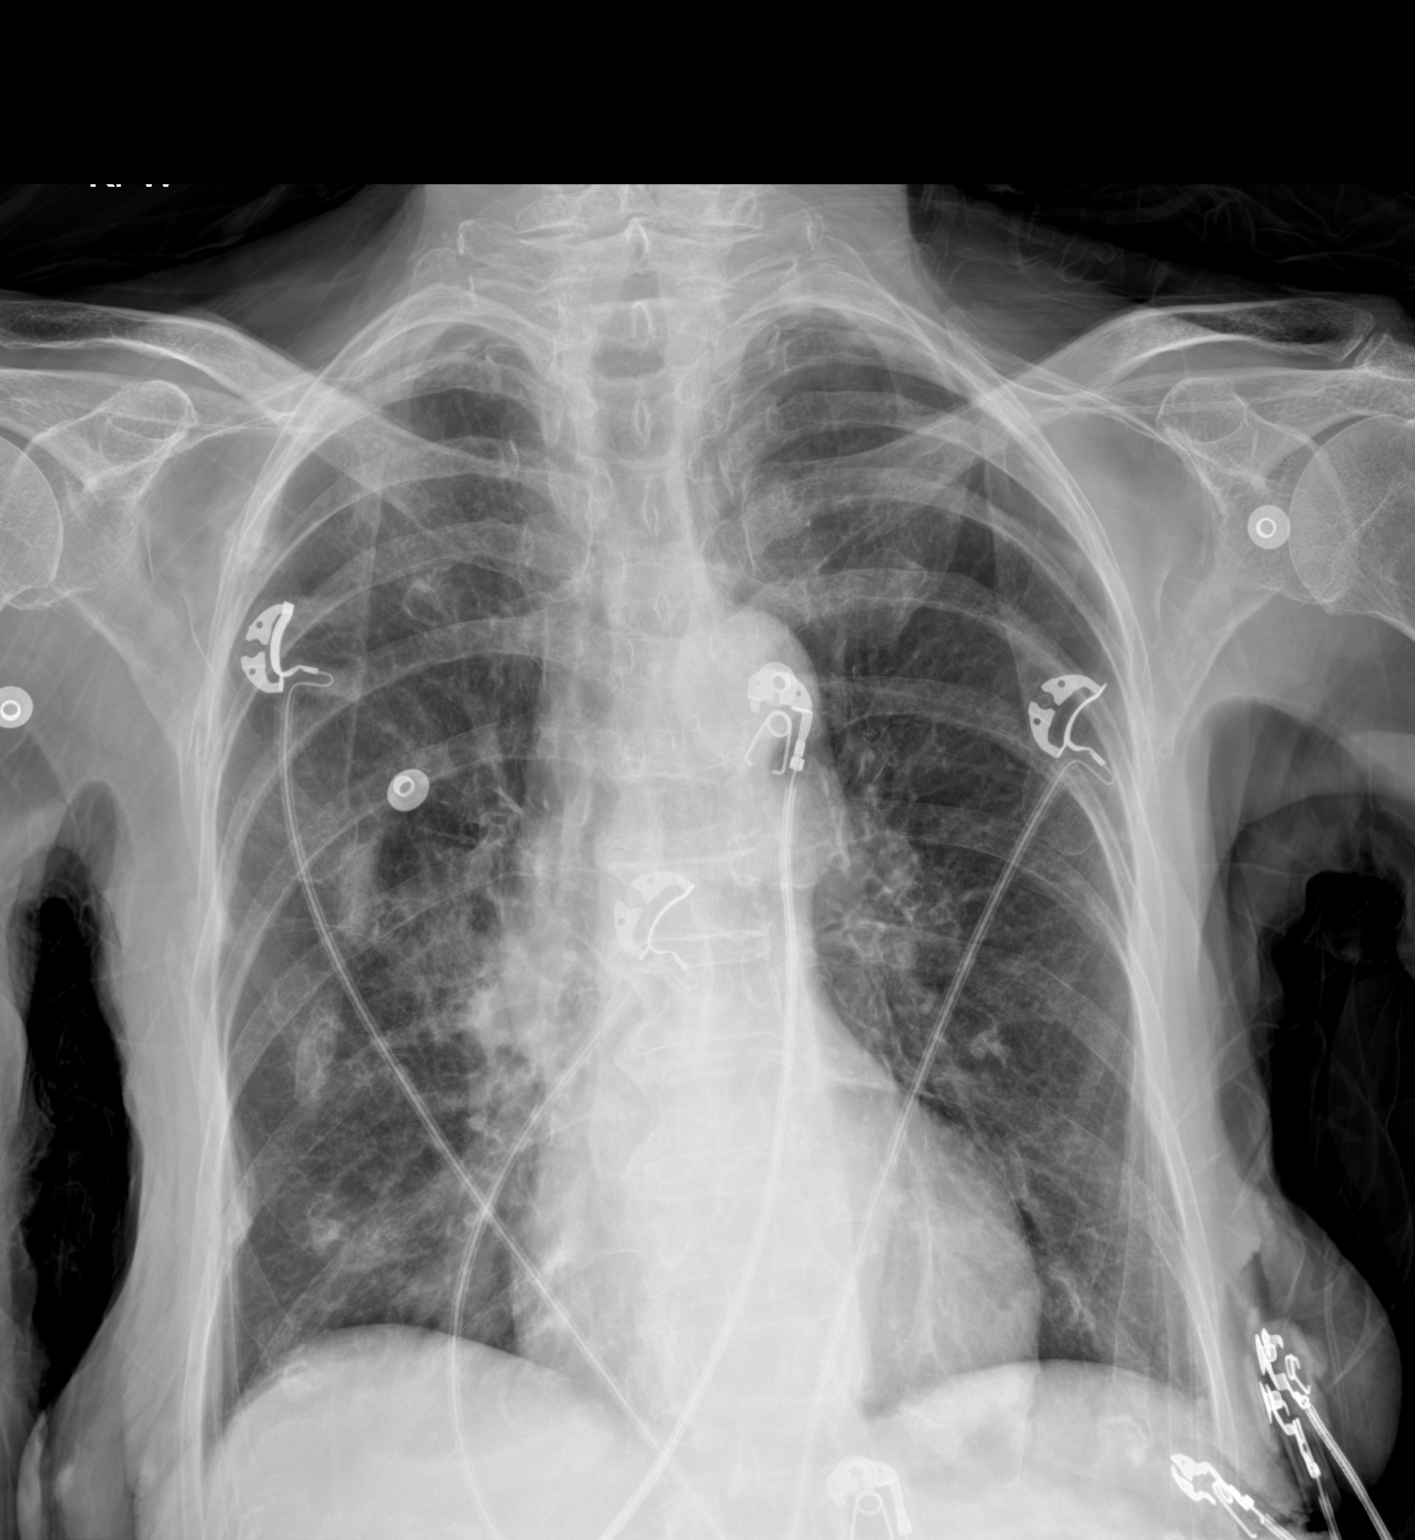

[1 of 1 positions shown; findings below may reference images not displayed]

FINDINGS: Normal cardiac silhouette. Lungs are hyperinflated. No pneumothorax.
Increased density in RIGHT lung base compared to prior. No acute
osseous abnormality.
IMPRESSION: RIGHT lower lobe atelectasis versus infiltrate.

Hyperinflated lungs
